# Patient Record
Sex: Female | Born: 1975 | Race: White | Hispanic: Yes | Smoking: Never smoker
Health system: Southern US, Community
[De-identification: ages and names within clinical notes are randomized; demographics above are authoritative.]

## PROBLEM LIST (undated history)

## (undated) ENCOUNTER — Ambulatory Visit (HOSPITAL_COMMUNITY): Source: Home / Self Care

## (undated) DIAGNOSIS — E119 Type 2 diabetes mellitus without complications: Secondary | ICD-10-CM

## (undated) HISTORY — PX: TUBAL LIGATION: SHX77

## (undated) HISTORY — PX: CARPAL TUNNEL RELEASE: SHX101

## (undated) HISTORY — PX: WISDOM TOOTH EXTRACTION: SHX21

---

## 2015-09-18 ENCOUNTER — Emergency Department (HOSPITAL_COMMUNITY): Payer: Self-pay

## 2015-09-18 ENCOUNTER — Emergency Department (HOSPITAL_COMMUNITY)
Admission: EM | Admit: 2015-09-18 | Discharge: 2015-09-18 | Disposition: A | Discharge status: Home / Self Care | Payer: Self-pay | Attending: Emergency Medicine | Admitting: Emergency Medicine

## 2015-09-18 ENCOUNTER — Encounter (HOSPITAL_COMMUNITY): Payer: Self-pay

## 2015-09-18 DIAGNOSIS — S93402A Sprain of unspecified ligament of left ankle, initial encounter: Secondary | ICD-10-CM | POA: Insufficient documentation

## 2015-09-18 DIAGNOSIS — Z880 Allergy status to penicillin: Secondary | ICD-10-CM | POA: Insufficient documentation

## 2015-09-18 DIAGNOSIS — Y998 Other external cause status: Secondary | ICD-10-CM | POA: Insufficient documentation

## 2015-09-18 DIAGNOSIS — E119 Type 2 diabetes mellitus without complications: Secondary | ICD-10-CM | POA: Insufficient documentation

## 2015-09-18 DIAGNOSIS — M79672 Pain in left foot: Secondary | ICD-10-CM

## 2015-09-18 DIAGNOSIS — W1841XA Slipping, tripping and stumbling without falling due to stepping on object, initial encounter: Secondary | ICD-10-CM | POA: Insufficient documentation

## 2015-09-18 DIAGNOSIS — Y9389 Activity, other specified: Secondary | ICD-10-CM | POA: Insufficient documentation

## 2015-09-18 DIAGNOSIS — Y9289 Other specified places as the place of occurrence of the external cause: Secondary | ICD-10-CM | POA: Insufficient documentation

## 2015-09-18 HISTORY — DX: Type 2 diabetes mellitus without complications: E11.9

## 2015-09-18 MED ORDER — IBUPROFEN 800 MG PO TABS: 800.0000 mg | ORAL_TABLET | Freq: Three times a day (TID) | ORAL | Status: DC

## 2015-09-18 NOTE — ED Notes (Signed)
Refused crutches

## 2015-09-18 NOTE — Discharge Instructions (Signed)

## 2015-09-18 NOTE — ED Notes (Signed)
Pt reports she tripped off her back deck this morning around 1030 and her whole entire left foot is hurting. Mild swelling to foot, no discoloration at this time. No LOC, not on blood thinners. Pt able to wiggle toes, + sensation, + pedal pulse.

## 2015-09-18 NOTE — ED Provider Notes (Signed)
CSN: [id number]     Arrival date & time 09/18/15  1622 History  This chart was scribed for non-physician Laurie Justiss [name], NP-C, working with Laurie Baptist, MD by Phillis Haggis, ED Scribe. This patient was seen in room TR02C/TR02C and patient care was started at 4:45 PM.   Chief Complaint  Patient presents with  . Foot Injury   The history is provided by the patient. No language interpreter was used.  HPI Comments: Laurie Neal is a 39 y.o. Female with a hx of DM who presents to the Emergency Department complaining of left foot injury onset 6 hours ago. Pt states that she tripped off the back deck, injuring her left foot. Reports associated left foot and ankle pain, with mild swelling. Reports worsening pain with weight bearing and ambulation. Denies any other injuries, hx of injury to the same area, or LOC.   Past Medical History  Diagnosis Date  . Diabetes mellitus without complication    Past Surgical History  Procedure Laterality Date  . Tubal ligation    . Wisdom tooth extraction    . Carpal tunnel release Left    No family history on file. Social History  Substance Use Topics  . Smoking status: Never Smoker   . Smokeless tobacco: None  . Alcohol Use: No   OB History    No data available     Review of Systems  Musculoskeletal: Positive for arthralgias.  Neurological: Negative for syncope.  All other systems reviewed and are negative.  Allergies  Penicillins  Home Medications   Prior to Admission medications   Not on File   BP 123/82 mmHg  Pulse 89  Temp(Src) 97.9 F (36.6 C) (Oral)  Resp 18  Ht  (1.549 m)  Wt 280 lb (127.007 kg)  BMI 52.93 kg/m2  SpO2 98%  LMP 08/18/2015  Physical Exam  Constitutional: She is oriented to person, place, and time. She appears well-developed and well-nourished.  HENT:  Head: Normocephalic and atraumatic.  Eyes: EOM are normal.  Neck: Normal range of motion. Neck supple.  Cardiovascular: Normal rate.    Pulmonary/Chest: Effort normal.  Musculoskeletal: Normal range of motion.  Tender on the lateral left foot and ankle. No gross deformity noted. Pulses intact  Neurological: She is alert and oriented to person, place, and time.  Skin: Skin is warm and dry.  Psychiatric: She has a normal mood and affect. Her behavior is normal.  Nursing note and vitals reviewed.   ED Course  Procedures (including critical care time) DIAGNOSTIC STUDIES: Oxygen Saturation is 98% on RA, normal by my interpretation.    COORDINATION OF CARE: 4:47 PM-Discussed treatment plan which includes x-ray (CXR, CBC panel, CMP, UA) with pt at bedside and pt agreed to plan.   Labs Review Labs Reviewed - No data to display  Imaging Review Dg Ankle Complete Left  09/18/2015   CLINICAL DATA:  Fall off step onto foot and ankle. Pain. Initial encounter.  EXAM: LEFT ANKLE COMPLETE - 3+ VIEW  COMPARISON:  None.  FINDINGS: The mineralization and alignment are normal. There is no evidence of acute fracture or dislocation. The joint spaces are maintained. There is minimal spurring of the medial malleolus and inferior calcaneal tuberosity. The soft tissues around the ankle are prominent without apparent focal swelling.  IMPRESSION: No acute osseous findings.   Electronically Signed   By: Carey Bullocks M.D.   On: 09/18/2015 17:42   Dg Foot Complete Left  09/18/2015   CLINICAL  DATA:  Fall off step onto foot and ankle. Pain. History of diabetes. Initial encounter.  EXAM: LEFT FOOT - COMPLETE 3+ VIEW  COMPARISON:  None.  FINDINGS: The mineralization and alignment are normal. There is no evidence of acute fracture or dislocation. The joint spaces are maintained. The alignment is normal at the Lisfranc joint. The tibial sesamoid of the first metatarsal is bipartite. Mild plantar calcaneal spurring noted.  IMPRESSION: No acute osseous findings.   Electronically Signed   By: Carey Bullocks M.D.   On: 09/18/2015 17:41      EKG  Interpretation None      MDM   Final diagnoses:  Ankle sprain, left, initial encounter  Left foot pain    No acute bony injury. Pt placed in aso and crutches for comfort  I personally performed the services described in this documentation, which was scribed in my presence. The recorded information has been reviewed and is accurate.    [name], NP 09/18/15 1748  [name], NP 09/18/15 1758  Laurie Baptist, MD 09/19/15 1356

## 2015-12-13 DIAGNOSIS — K769 Liver disease, unspecified: Secondary | ICD-10-CM | POA: Insufficient documentation

## 2016-10-31 IMAGING — DX DG FOOT COMPLETE 3+V*L*
3 series · 3 of 3 positions shown · non-contrast
Comparison: None.

CLINICAL DATA: Fall off step onto foot and ankle. Pain. History of
diabetes. Initial encounter.

EXAM:
LEFT FOOT - COMPLETE 3+ VIEW

[x foot ap left]
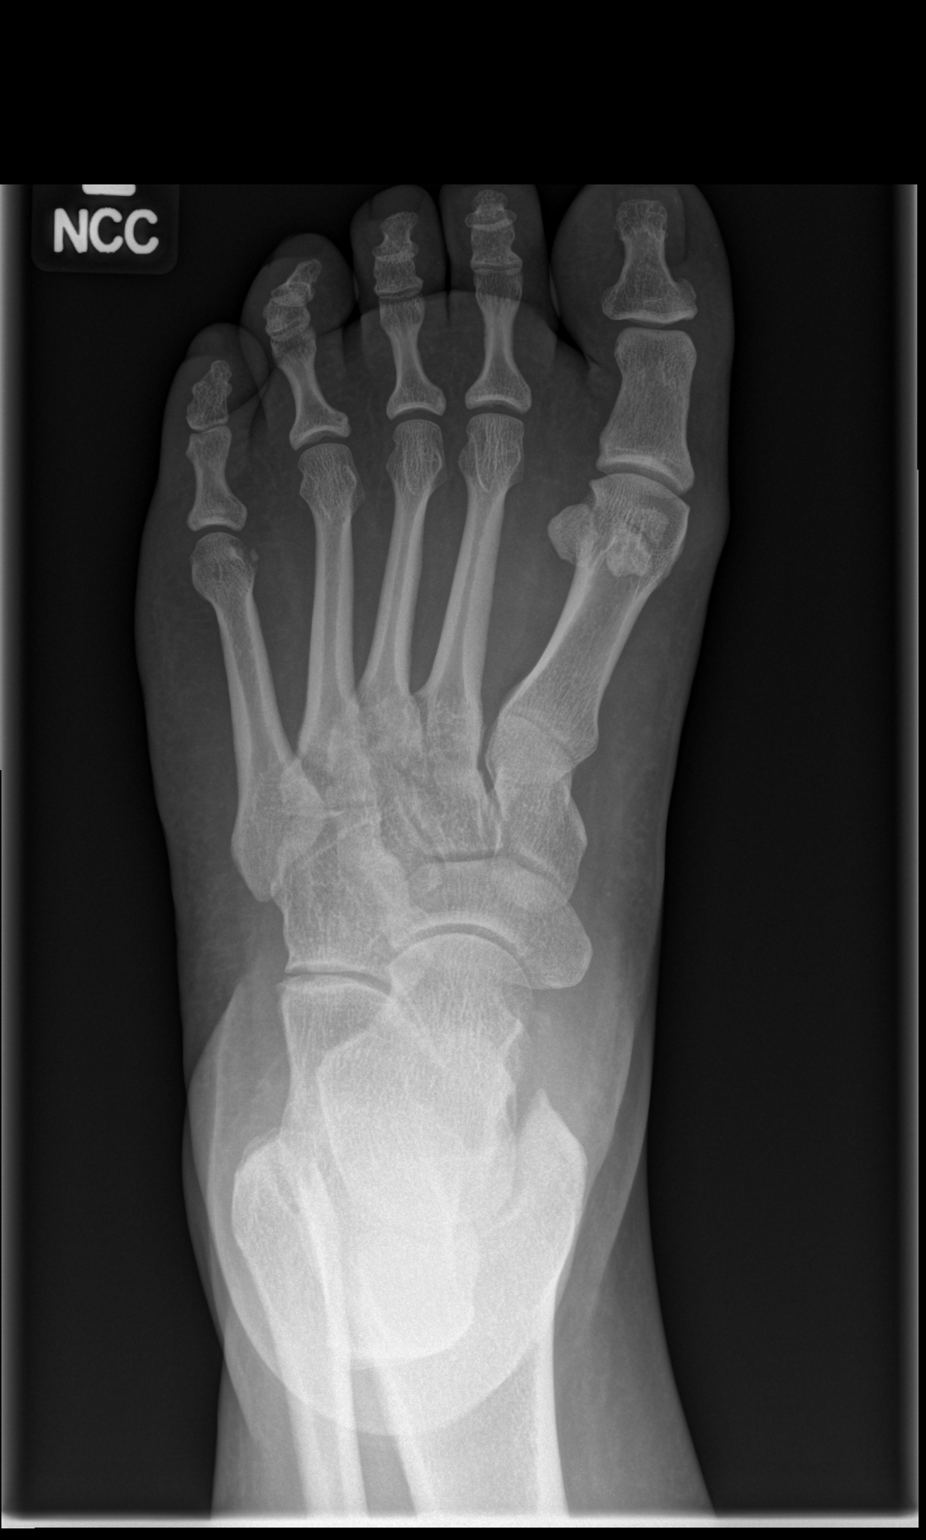

[x foot obl left]
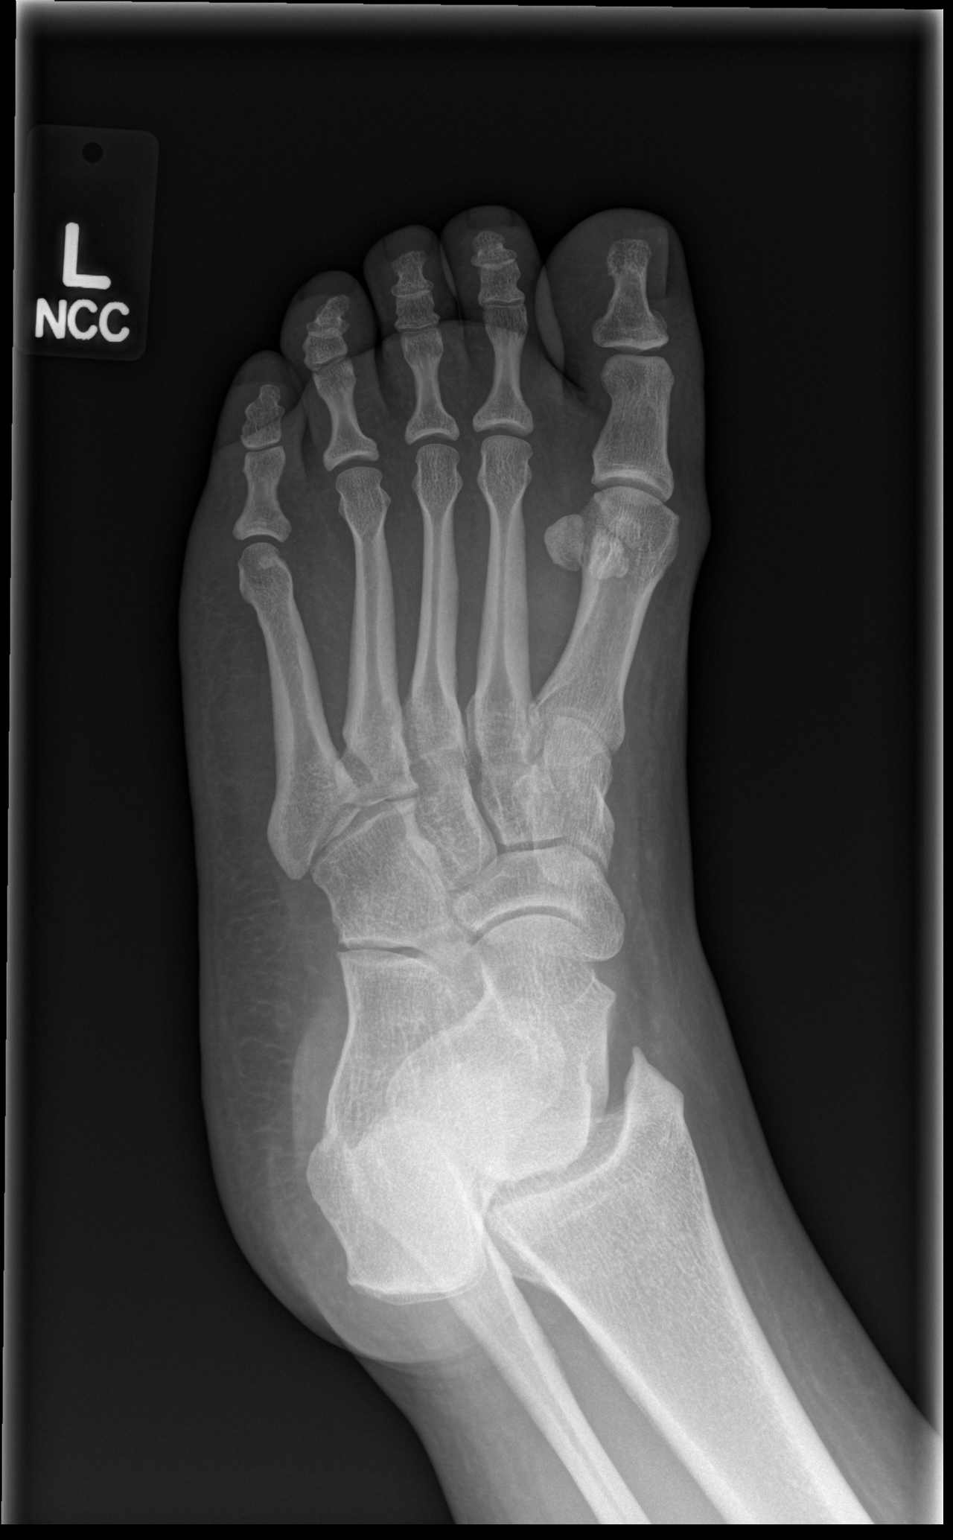

[x foot lat left]
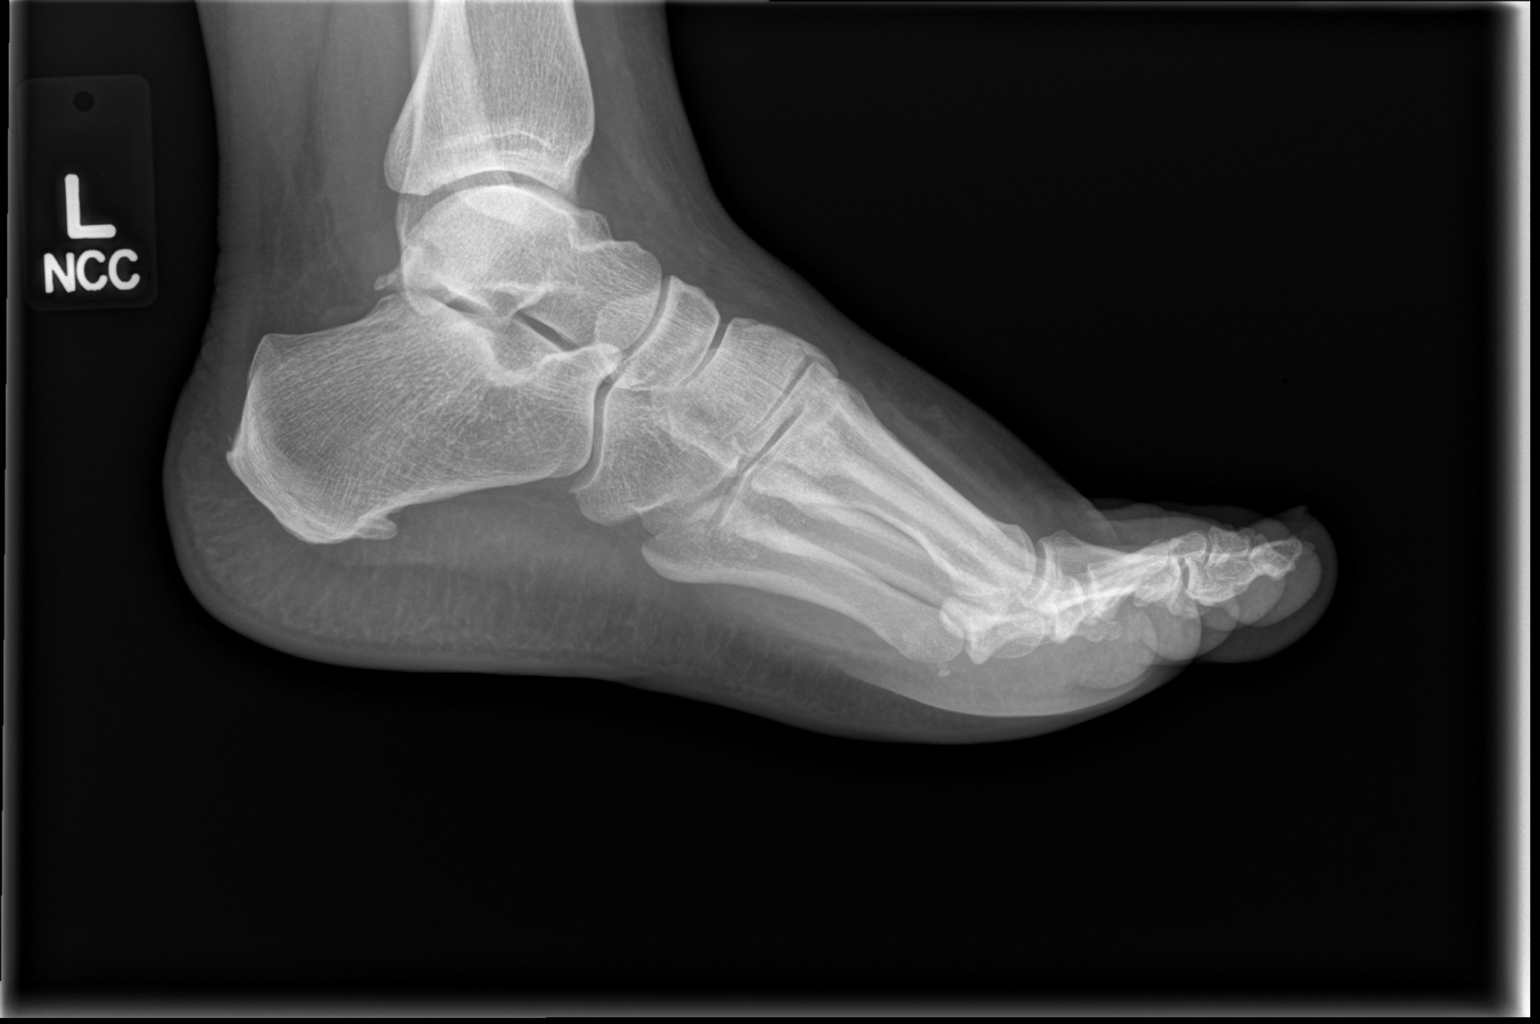

[3 of 3 positions shown; findings below may reference images not displayed]

FINDINGS: The mineralization and alignment are normal. There is no evidence of
acute fracture or dislocation. The joint spaces are maintained. The
alignment is normal at the Lisfranc joint. The tibial sesamoid of
the first metatarsal is bipartite. Mild plantar calcaneal spurring
noted.
IMPRESSION: No acute osseous findings.

## 2016-10-31 IMAGING — DX DG ANKLE COMPLETE 3+V*L*
3 series · 3 of 3 positions shown · non-contrast
Comparison: None.

CLINICAL DATA: Fall off step onto foot and ankle. Pain. Initial
encounter.

EXAM:
LEFT ANKLE COMPLETE - 3+ VIEW

[x ankle ap left]
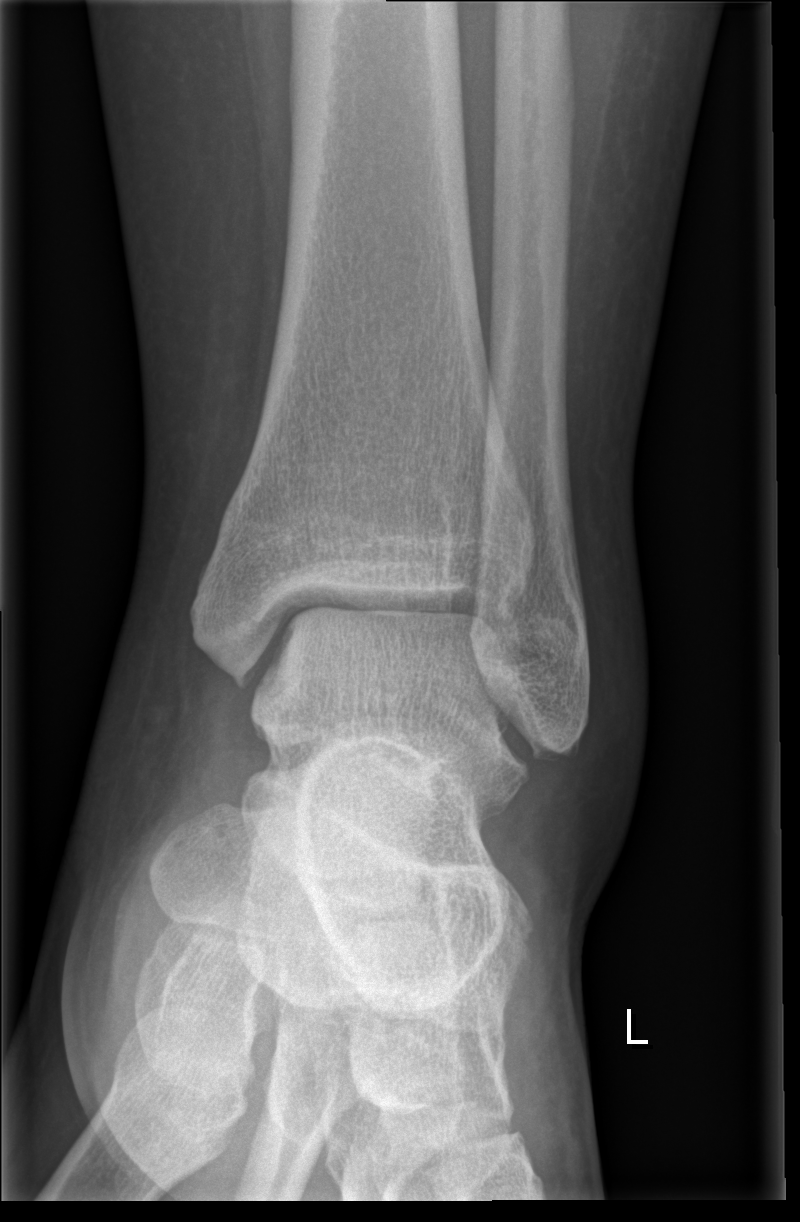

[x ankle obl left]
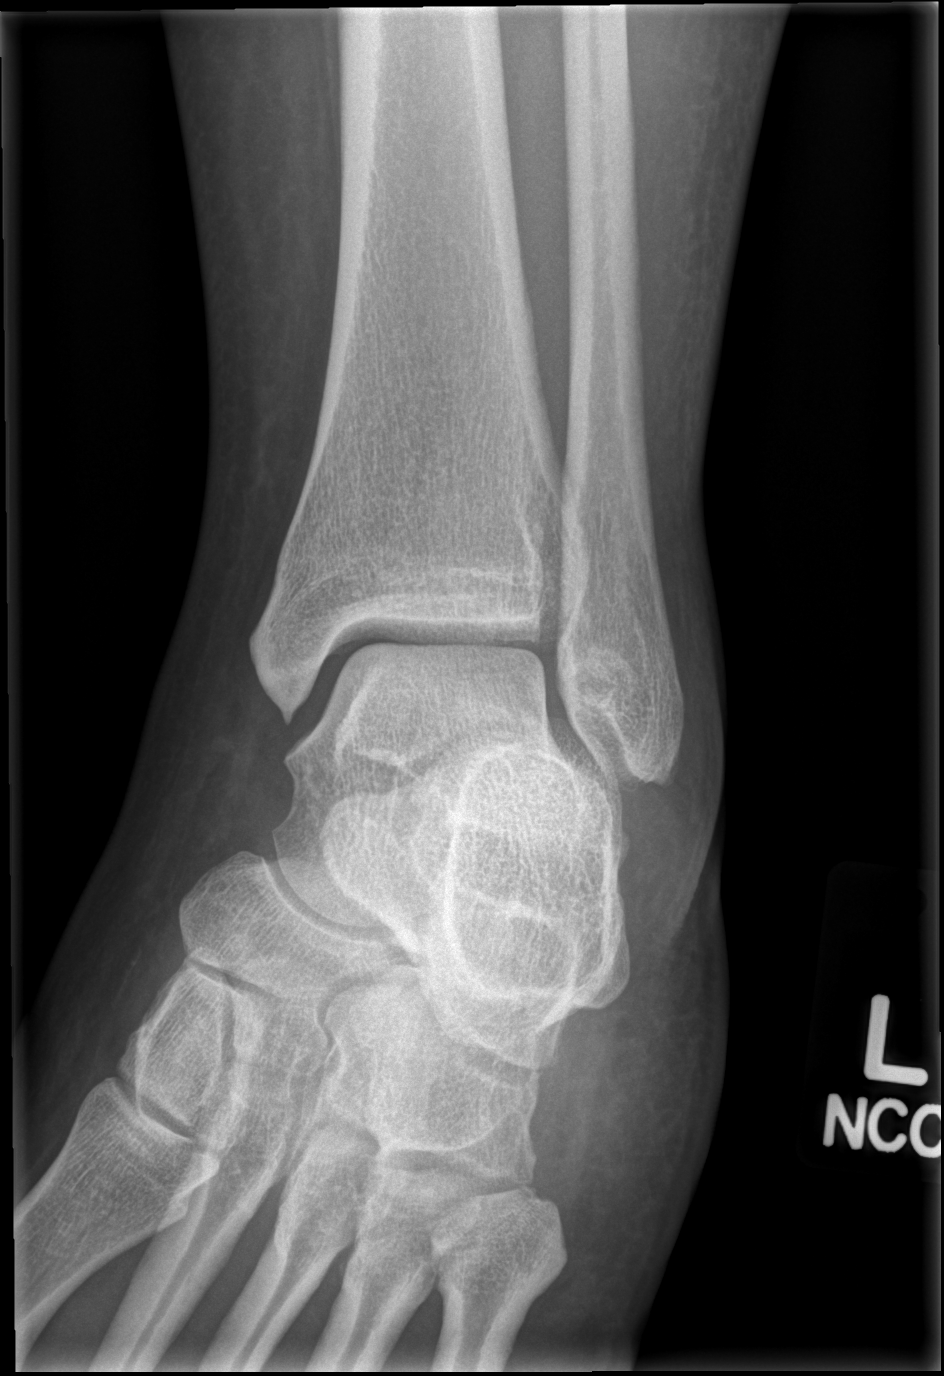

[x ankle lat left]
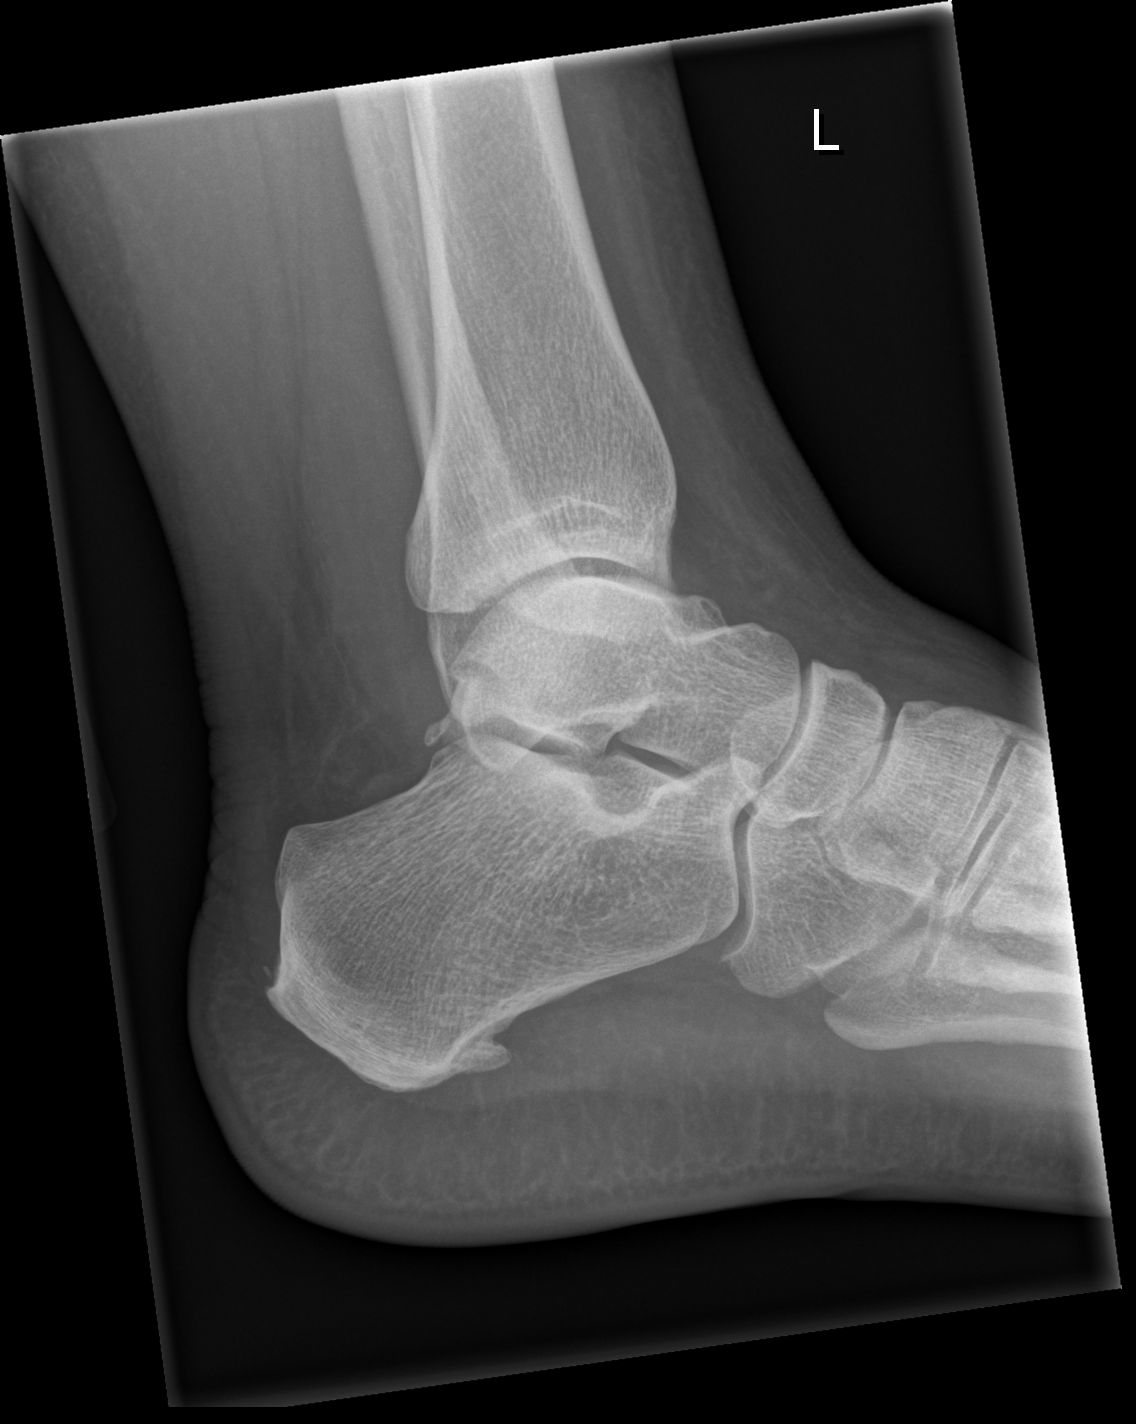

[3 of 3 positions shown; findings below may reference images not displayed]

FINDINGS: The mineralization and alignment are normal. There is no evidence of
acute fracture or dislocation. The joint spaces are maintained.
There is minimal spurring of the medial malleolus and inferior
calcaneal tuberosity. The soft tissues around the ankle are
prominent without apparent focal swelling.
IMPRESSION: No acute osseous findings.

## 2017-05-21 ENCOUNTER — Telehealth: Payer: Self-pay | Admitting: Emergency Medicine

## 2017-07-03 DIAGNOSIS — R809 Proteinuria, unspecified: Secondary | ICD-10-CM | POA: Insufficient documentation

## 2017-09-30 DIAGNOSIS — N182 Chronic kidney disease, stage 2 (mild): Secondary | ICD-10-CM | POA: Insufficient documentation

## 2017-09-30 DIAGNOSIS — K30 Functional dyspepsia: Secondary | ICD-10-CM | POA: Insufficient documentation

## 2017-09-30 DIAGNOSIS — E119 Type 2 diabetes mellitus without complications: Secondary | ICD-10-CM | POA: Insufficient documentation

## 2018-01-27 DIAGNOSIS — D573 Sickle-cell trait: Secondary | ICD-10-CM | POA: Insufficient documentation

## 2018-01-27 DIAGNOSIS — F419 Anxiety disorder, unspecified: Secondary | ICD-10-CM | POA: Insufficient documentation

## 2018-09-08 DIAGNOSIS — J309 Allergic rhinitis, unspecified: Secondary | ICD-10-CM | POA: Insufficient documentation

## 2021-03-15 LAB — RESULTS CONSOLE HPV: CHL HPV: NEGATIVE

## 2023-03-04 DIAGNOSIS — K824 Cholesterolosis of gallbladder: Secondary | ICD-10-CM | POA: Insufficient documentation

## 2023-05-31 LAB — HM MAMMOGRAPHY

## 2023-12-02 DIAGNOSIS — E785 Hyperlipidemia, unspecified: Secondary | ICD-10-CM | POA: Insufficient documentation

## 2024-06-01 ENCOUNTER — Encounter: Payer: Self-pay | Admitting: Family Medicine

## 2024-06-02 ENCOUNTER — Ambulatory Visit: Admitting: Family Medicine

## 2024-06-02 ENCOUNTER — Encounter: Payer: Self-pay | Admitting: Family Medicine

## 2024-06-02 VITALS — BP 124/78 | HR 85 | Temp 97.6°F | Ht 61.0 in | Wt 228.8 lb

## 2024-06-02 DIAGNOSIS — E119 Type 2 diabetes mellitus without complications: Secondary | ICD-10-CM | POA: Diagnosis not present

## 2024-06-02 DIAGNOSIS — Z7985 Long-term (current) use of injectable non-insulin antidiabetic drugs: Secondary | ICD-10-CM

## 2024-06-02 DIAGNOSIS — F419 Anxiety disorder, unspecified: Secondary | ICD-10-CM

## 2024-06-02 DIAGNOSIS — F32A Depression, unspecified: Secondary | ICD-10-CM

## 2024-06-02 DIAGNOSIS — E782 Mixed hyperlipidemia: Secondary | ICD-10-CM

## 2024-06-02 DIAGNOSIS — Z1211 Encounter for screening for malignant neoplasm of colon: Secondary | ICD-10-CM | POA: Diagnosis not present

## 2024-06-02 DIAGNOSIS — Z794 Long term (current) use of insulin: Secondary | ICD-10-CM | POA: Diagnosis not present

## 2024-06-02 MED ORDER — CITALOPRAM HYDROBROMIDE 10 MG PO TABS: 10.0000 mg | ORAL_TABLET | Freq: Every day | ORAL | 3 refills | Status: DC

## 2024-06-02 MED ORDER — BASAGLAR KWIKPEN 100 UNIT/ML ~~LOC~~ SOPN: 35.0000 [IU] | PEN_INJECTOR | Freq: Every day | SUBCUTANEOUS | 3 refills | Status: DC

## 2024-06-02 MED ORDER — FREESTYLE LIBRE 2 SENSOR MISC: 3 refills | Status: AC

## 2024-06-02 NOTE — Assessment & Plan Note (Signed)
 I will check annual DM labs today. It appears her insurance will cover insulin glargine (Basaglar). I will switch her to this. we will also continue insulin aspart (Novolog) 18 units with meals, and semaglutide (Ozempic) 2 mg weekly. I will renew her freestyle Laurie Neal so she can go back to monitoring her blood sugars.

## 2024-06-02 NOTE — Assessment & Plan Note (Signed)
 I will check her lipids today. Continue lovastatin 10 mg daily.

## 2024-06-02 NOTE — Patient Instructions (Signed)
Deep Breathing Technique ? ?1. Find a comfortable, quiet place to sit or lie down. Choose a spot where you know you won't be disturbed. If sitting, keep your back straight and your feet flat on the floor. Close your eyes. ? ?2. Place one hand on your belly, just below your ribs. Place the other hand on your chest. ? ?3. Take a regular breath. ? ?4. Now take a slow, deep breath over about 6 seconds. Breathe in slowly through your nose. Pay attention as your belly swells up under your hand. ? ?5. Holding your breath, pause for a four to five seconds. ? ?6. Slowly breathe out through your mouth over about 6 seconds. Pay attention as the hand on your belly goes in with the breath. ? ?7. Now add images to your breathing. As you inhale, imagine that the air you're breathing is spreading relaxation and calmness throughout your body. ? ?8. As you exhale, imagine that your breath is whooshing away stress and tension. ? ?9. Try to deep breathe for 6-10 cycles. ? ?

## 2024-06-02 NOTE — Progress Notes (Signed)
 [facility] [address] [place] [address] Dept: [phone] Dept Fax: [phone]  New Patient Office Visit  Subjective:    Patient ID: Laurie Neal, female    DOB: [birth year], 48 y.o..   MRN: [id number]  Chief Complaint  Patient presents with   Establish Care    NP establish care.  C/o having anxiety and being out of diabetes medication.    History of Present Illness:  Patient is in today to establish care. Laurie Neal was born in [place], [place]. She moved to [facility] when she was 48 years old. She does office work for [employer]. She has a boyfriend. She has two sons (25, 84) and two step-children she is still involved with (18, 21). She has 5 grandchildren. Laurie Neal denies use of tobacco, alcohol, or drugs.  Laurie Neal  has a history of Type 2 diabetes, diagnosed around 7-8 years ago. She was managed previously on metformin, Janumet, and Trulicity, however is currently managed on insulin glargine (Lantus) 35 untis at bedtime, insulin aspart (Novolog) 18 units with meals, and semaglutide (Ozempic) 2 mg weekly. She has experienced some disruption in her therapy related to a transition in medical insurance. She has been off of most of her meds over the past month. She was told her Lantus is not on formulary with her new insurance. She has recently been back on her Ozempic. She is also manage don losartan 25 mg daily for renal protection.  Laurie Neal has hyperlipidemia. She is managed on lovastatin 10 mg daily.  Laurie Neal has issues with right hand and bilateral foot pain. She takes naproxen for management of her discomfort.  Laurie Neal  notes an increasing issue with anxiety. She finds this to be primarily work-related, but notes she had some shakiness when coming to our clinic today. She also has some emotional lability, finding she snaps at times.  Past Medical History: Patient Active Problem List    Diagnosis Date Noted   Hyperlipidemia 12/02/2023   Gallbladder polyp 03/04/2023   Morbid obesity (HCC) 09/10/2022   Allergic rhinitis 09/08/2018   Sickle cell trait (HCC) 01/27/2018   Anxiety and depression 01/27/2018   Type 2 diabetes mellitus without complications (HCC) 09/30/2017   Functional dyspepsia 09/30/2017   Microalbuminuria 07/03/2017   Liver lesion 12/13/2015   Past Surgical History:  Procedure Laterality Date   CARPAL TUNNEL RELEASE Left    TUBAL LIGATION     WISDOM TOOTH EXTRACTION     Family History  Problem Relation Age of Onset   Hypertension Mother    Cancer Father        prostrate cancer   Outpatient Medications Prior to Visit  Medication Sig Dispense Refill   glucose blood (FREESTYLE TEST STRIPS) test strip Check sugars twice daily     Insulin Aspart FlexPen (NOVOLOG) 100 UNIT/ML Inject 18 Units into the skin.     Insulin Pen Needle (BD PEN NEEDLE MINI ULTRAFINE) 31G X 5 MM MISC daily.     Lancets 28G Thin MISC Check glucose up to 3 times daily     loperamide (IMODIUM) 2 MG capsule Take 2 mg by mouth.     losartan (COZAAR) 25 MG tablet Take 1 tablet by mouth daily.     lovastatin (MEVACOR) 10 MG tablet Take 10 mg by mouth at bedtime.     ondansetron (ZOFRAN) 4 MG tablet Take 4 mg by mouth.     OZEMPIC, 2 MG/DOSE, 8  MG/3ML SOPN Inject 2 mg into the skin once a week.     insulin glargine (LANTUS SOLOSTAR) 100 UNIT/ML Solostar Pen Inject 35 Units into the skin daily.     ibuprofen  (ADVIL ,MOTRIN ) 800 MG tablet Take 1 tablet (800 mg total) by mouth 3 (three) times daily. 21 tablet 0   naproxen (NAPROSYN) 500 MG tablet Take 500 mg by mouth 2 (two) times daily.     No facility-administered medications prior to visit.   Allergies  Allergen Reactions   Latex Dermatitis and Other (See Comments)    Burning and itching  Burning and itching  Other reaction(s): Other (See Comments)  Burning and itching  Other reaction(s): Other (See Comments)  Burning and  itching  Other reaction(s): Other (See Comments)  Burning and itching   Penicillins Itching   Objective:   Today's Vitals   06/02/24 1552  BP: 124/78  Pulse: 85  Temp: 97.6 F (36.4 C)  TempSrc: Temporal  SpO2: 99%  Weight: 228 lb 12.8 oz (103.8 kg)  Height: 5\' 1"  (1.549 m)   Body mass index is 43.23 kg/m.   General: Well developed, well nourished. No acute distress. Psych: Alert and oriented. Normal mood and affect.  Health Maintenance Due  Topic Date Due   HEMOGLOBIN A1C  Never done   FOOT EXAM  Never done   OPHTHALMOLOGY EXAM  Never done   HIV Screening  Never done   Diabetic kidney evaluation - eGFR measurement  Never done   Diabetic kidney evaluation - Urine ACR  Never done   Hepatitis C Screening  Never done   DTaP/Tdap/Td (1 - Tdap) Never done   Pneumococcal Vaccine 24-40 Years old (1 of 2 - PCV) Never done   Cervical Cancer Screening (HPV/Pap Cotest)  Never done   Colonoscopy  Never done     Assessment & Plan:   Problem List Items Addressed This Visit       Endocrine   Type 2 diabetes mellitus without complications (HCC) - Primary   I will check annual DM labs today. It appears her insurance will cover insulin glargine (Basaglar). I will switch her to this. we will also continue insulin aspart (Novolog) 18 units with meals, and semaglutide (Ozempic) 2 mg weekly. I will renew her freestyle Jerrilyn Moras so she can go back to monitoring her blood sugars.      Relevant Medications   OZEMPIC, 2 MG/DOSE, 8 MG/3ML SOPN   lovastatin (MEVACOR) 10 MG tablet   losartan (COZAAR) 25 MG tablet   Insulin Aspart FlexPen (NOVOLOG) 100 UNIT/ML   Insulin Glargine (BASAGLAR KWIKPEN) 100 UNIT/ML   Continuous Glucose Sensor (FREESTYLE LIBRE 2 SENSOR) MISC   Other Relevant Orders   Lipid panel   Microalbumin / creatinine urine ratio   Basic metabolic panel with GFR   Hemoglobin A1c   Urinalysis, Routine w reflex microscopic     Other   Anxiety and depression   Discussed  possible counseling. Recommend deep breathing to control anxious feelings. I will start ehr on citalopram 10 mg daily. Reassess in 6 weeks.      Relevant Medications   citalopram (CELEXA) 10 MG tablet   Hyperlipidemia   I will check her lipids today. Continue lovastatin 10 mg daily.      Relevant Medications   lovastatin (MEVACOR) 10 MG tablet   losartan (COZAAR) 25 MG tablet   Other Visit Diagnoses       Screening for colon cancer       Relevant  Orders   Ambulatory referral to Gastroenterology       Return in about 6 weeks (around 07/14/2024) for Reassessment.   Graig Lawyer, MD

## 2024-06-02 NOTE — Assessment & Plan Note (Signed)
 Discussed possible counseling. Recommend deep breathing to control anxious feelings. I will start ehr on citalopram 10 mg daily. Reassess in 6 weeks.

## 2024-06-03 ENCOUNTER — Other Ambulatory Visit

## 2024-06-03 ENCOUNTER — Ambulatory Visit: Payer: Self-pay | Admitting: Family Medicine

## 2024-06-03 DIAGNOSIS — R7309 Other abnormal glucose: Secondary | ICD-10-CM

## 2024-06-03 DIAGNOSIS — N182 Chronic kidney disease, stage 2 (mild): Secondary | ICD-10-CM | POA: Insufficient documentation

## 2024-06-03 LAB — BASIC METABOLIC PANEL WITH GFR
BUN: 11 mg/dL (ref 6–23)
CO2: 27 meq/L (ref 19–32)
Calcium: 9.7 mg/dL (ref 8.4–10.5)
Chloride: 99 meq/L (ref 96–112)
Creatinine, Ser: 0.85 mg/dL (ref 0.40–1.20)
GFR: 82.68 mL/min (ref >60.00)
Glucose, Bld: 371 mg/dL — ABNORMAL HIGH (ref 70–99)
Potassium: 3.9 meq/L (ref 3.5–5.1)
Sodium: 134 meq/L — ABNORMAL LOW (ref 135–145)

## 2024-06-03 LAB — LIPID PANEL
Cholesterol: 159 mg/dL (ref 0–200)
HDL: 32 mg/dL — ABNORMAL LOW (ref >39.00)
LDL Cholesterol: 99 mg/dL (ref 0–99)
NonHDL: 127.24
Total CHOL/HDL Ratio: 5
Triglycerides: 142 mg/dL (ref 0.0–149.0)
VLDL: 28.4 mg/dL (ref 0.0–40.0)

## 2024-06-03 LAB — URINALYSIS, ROUTINE W REFLEX MICROSCOPIC
Bilirubin Urine: NEGATIVE
Ketones, ur: NEGATIVE
Nitrite: NEGATIVE
Specific Gravity, Urine: 1.01 (ref 1.000–1.030)
Total Protein, Urine: NEGATIVE
Urine Glucose: >=1000 — AB
Urobilinogen, UA: 0.2 (ref 0.0–1.0)
pH: 6.5 (ref 5.0–8.0)

## 2024-06-03 LAB — MICROALBUMIN / CREATININE URINE RATIO
Creatinine,U: 27.2 mg/dL
Microalb Creat Ratio: 59.1 mg/g — ABNORMAL HIGH (ref 0.0–30.0)
Microalb, Ur: 1.6 mg/dL (ref 0.0–1.9)

## 2024-06-04 ENCOUNTER — Ambulatory Visit: Payer: Self-pay | Admitting: Family Medicine

## 2024-06-04 LAB — HEMOGLOBIN A1C
Est. average glucose Bld gHb Est-mCnc: 280 mg/dL
Hgb A1c MFr Bld: 11.4 % — ABNORMAL HIGH (ref 4.8–5.6)

## 2024-06-05 ENCOUNTER — Telehealth: Payer: Self-pay

## 2024-06-05 NOTE — Telephone Encounter (Signed)
 Copied from CRM [id number]. Topic: General - Other >> Jun 05, 2024  8:20 AM Antonieta Kitten wrote: Reason for CRM: Patient requesting a call to get help setting her arm device app measurements

## 2024-06-05 NOTE — Telephone Encounter (Signed)
 Spoke to patient and advised that we would have to have her come in for a nurse visit so I could see what her app on her phone is telling her.  She declined appt and will wait till her upcoming appt. Dm/cma

## 2024-07-01 ENCOUNTER — Encounter: Payer: Self-pay | Admitting: Family Medicine

## 2024-07-01 ENCOUNTER — Ambulatory Visit: Admitting: Family Medicine

## 2024-07-01 VITALS — BP 120/72 | HR 88 | Temp 97.8°F | Ht 61.0 in | Wt 228.6 lb

## 2024-07-01 DIAGNOSIS — B353 Tinea pedis: Secondary | ICD-10-CM

## 2024-07-01 DIAGNOSIS — N182 Chronic kidney disease, stage 2 (mild): Secondary | ICD-10-CM | POA: Diagnosis not present

## 2024-07-01 DIAGNOSIS — E782 Mixed hyperlipidemia: Secondary | ICD-10-CM

## 2024-07-01 DIAGNOSIS — Z794 Long term (current) use of insulin: Secondary | ICD-10-CM

## 2024-07-01 DIAGNOSIS — F419 Anxiety disorder, unspecified: Secondary | ICD-10-CM

## 2024-07-01 DIAGNOSIS — B351 Tinea unguium: Secondary | ICD-10-CM

## 2024-07-01 DIAGNOSIS — Z7985 Long-term (current) use of injectable non-insulin antidiabetic drugs: Secondary | ICD-10-CM

## 2024-07-01 DIAGNOSIS — F32A Depression, unspecified: Secondary | ICD-10-CM

## 2024-07-01 DIAGNOSIS — E1122 Type 2 diabetes mellitus with diabetic chronic kidney disease: Secondary | ICD-10-CM

## 2024-07-01 MED ORDER — LOSARTAN POTASSIUM 25 MG PO TABS: 25.0000 mg | ORAL_TABLET | Freq: Every day | ORAL | 3 refills | Status: AC

## 2024-07-01 MED ORDER — OZEMPIC (2 MG/DOSE) 8 MG/3ML ~~LOC~~ SOPN: 2.0000 mg | PEN_INJECTOR | SUBCUTANEOUS | 3 refills | Status: AC

## 2024-07-01 MED ORDER — BASAGLAR KWIKPEN 100 UNIT/ML ~~LOC~~ SOPN: 45.0000 [IU] | PEN_INJECTOR | Freq: Every day | SUBCUTANEOUS | 3 refills | Status: DC

## 2024-07-01 MED ORDER — CITALOPRAM HYDROBROMIDE 20 MG PO TABS: 20.0000 mg | ORAL_TABLET | Freq: Every day | ORAL | 3 refills | Status: AC

## 2024-07-01 MED ORDER — LOVASTATIN 10 MG PO TABS: 10.0000 mg | ORAL_TABLET | Freq: Every day | ORAL | 3 refills | Status: AC

## 2024-07-01 NOTE — Progress Notes (Signed)
 [facility] [address] [place] [address] Dept: [phone] Dept Fax: [phone]  Chronic Care Office Visit  Subjective:    Patient ID: Laurie Neal, female    DOB: 1976-06-21, 48 y.o..   MRN: [id number]  Chief Complaint  Patient presents with   Diabetes    4 week f/u DM.  Home BS averaging 66-320.   Still feeling a little anxious.     History of Present Illness:  Patient is in today for reassessment of chronic medical issues.  Ms. Bassford  has a history of Type 2 diabetes, diagnosed around 7-8 years ago. She was managed previously on metformin, Janumet, and Trulicity, however is currently managed on insulin glargine  (Lantus) 35 untis at bedtime, insulin aspart (Novolog) 18 units with meals, and semaglutide  (Ozempic ) 2 mg weekly. She has been back on her medication, after disruption in her therapy prior to her first appointment with me. She feels frustrated by her blood sugars being up and down. She has had a sugar as low as 66. She often gets symptoms of a low sugar when it is in the 80s.   Ms. Marshman labs at her last visit showed stage 2 (G2/A2) chronic kidney disease. She is managed on losartan  25 mg daily.   Ms. Nutter has hyperlipidemia. She is managed on lovastatin  10 mg daily.   At her last visit, Ms. Jeane noted an increasing issue with anxiety. She finds this to be primarily work-related, but notes some shakiness when coming to our clinic. She also had some emotional lability, finding she snaps at times. I started her on citalopram . She notes that her mood is mildly improved on the 10 mg dose.  Past Medical History: Patient Active Problem List   Diagnosis Date Noted   Chronic kidney disease (CKD) stage G2/A2, mildly decreased glomerular filtration rate (GFR) between 60-89 mL/min/1.73 square meter and albuminuria creatinine ratio between 30-299 mg/g 06/03/2024   Hyperlipidemia 12/02/2023   Gallbladder polyp  03/04/2023   Morbid obesity (HCC) 09/10/2022   Allergic rhinitis 09/08/2018   Sickle cell trait (HCC) 01/27/2018   Anxiety and depression 01/27/2018   Type 2 diabetes mellitus with stage 2 chronic kidney disease, with long-term current use of insulin (HCC) 09/30/2017   Functional dyspepsia 09/30/2017   Microalbuminuria 07/03/2017   Liver lesion 12/13/2015   Past Surgical History:  Procedure Laterality Date   CARPAL TUNNEL RELEASE Left    TUBAL LIGATION     WISDOM TOOTH EXTRACTION     Family History  Problem Relation Age of Onset   Hypertension Mother    Cancer Father        prostrate cancer   Hypertension Maternal Aunt    Cancer Maternal Grandmother        Throat   Outpatient Medications Prior to Visit  Medication Sig Dispense Refill   Continuous Glucose Sensor (FREESTYLE LIBRE 2 SENSOR) MISC Use as directed 6 each 3   glucose blood (FREESTYLE TEST STRIPS) test strip Check sugars twice daily     ibuprofen  (ADVIL ,MOTRIN ) 800 MG tablet Take 1 tablet (800 mg total) by mouth 3 (three) times daily. 21 tablet 0   Insulin Aspart FlexPen (NOVOLOG) 100 UNIT/ML Inject 18 Units into the skin.     Insulin Pen Needle (BD PEN NEEDLE MINI ULTRAFINE) 31G X 5 MM MISC daily.     Lancets 28G Thin MISC Check glucose up to 3 times daily     loperamide (IMODIUM) 2 MG capsule Take 2  mg by mouth.     naproxen (NAPROSYN) 500 MG tablet Take 500 mg by mouth 2 (two) times daily.     ondansetron (ZOFRAN) 4 MG tablet Take 4 mg by mouth.     citalopram  (CELEXA ) 10 MG tablet Take 1 tablet (10 mg total) by mouth daily. 30 tablet 3   Insulin Glargine  (BASAGLAR  KWIKPEN) 100 UNIT/ML Inject 35 Units into the skin daily. 15 mL 3   losartan  (COZAAR ) 25 MG tablet Take 1 tablet by mouth daily.     lovastatin  (MEVACOR ) 10 MG tablet Take 10 mg by mouth at bedtime.     OZEMPIC , 2 MG/DOSE, 8 MG/3ML SOPN Inject 2 mg into the skin once a week.     No facility-administered medications prior to visit.   Allergies   Allergen Reactions   Latex Dermatitis and Other (See Comments)    Burning and itching  Burning and itching  Other reaction(s): Other (See Comments)  Burning and itching  Other reaction(s): Other (See Comments)  Burning and itching  Other reaction(s): Other (See Comments)  Burning and itching   Penicillins Itching   Objective:   Today's Vitals   07/01/24 1502  BP: 120/72  Pulse: 88  Temp: 97.8 F (36.6 C)  TempSrc: Temporal  SpO2: 98%  Weight: 228 lb 9.6 oz (103.7 kg)  Height: 5' 1 (1.549 m)   Body mass index is 43.19 kg/m.   General: Well developed, well nourished. No acute distress. Feet- Skin intact. Mild maceration between toes and some small macules along the foot margin and the ankle. Nails are   discolored with some deformity and splitting. Dorsalis pedis and posterior tibial artery pulses are normal. 5.07   monofilament testing normal. Psych: Alert and oriented. Normal mood and affect.  Health Maintenance Due  Topic Date Due   OPHTHALMOLOGY EXAM  Never done   HIV Screening  Never done   Hepatitis C Screening  Never done   DTaP/Tdap/Td (1 - Tdap) Never done   Pneumococcal Vaccine 62-21 Years old (1 of 2 - PCV) Never done   Hepatitis B Vaccines (1 of 3 - 19+ 3-dose series) Never done   HPV VACCINES (1 - 3-dose SCDM series) Never done   Cervical Cancer Screening (HPV/Pap Cotest)  Never done   Colonoscopy  Never done     Lab Results: Lab Results  Component Value Date   HGBA1C 11.4 (H) 06/03/2024      Latest Ref Rng & Units 06/02/2024    4:43 PM  CMP  Glucose 70 - 99 mg/dL 628   BUN 6 - 23 mg/dL 11   Creatinine 9.59 - 1.20 mg/dL 9.14   Sodium 864 - 854 mEq/L 134   Potassium 3.5 - 5.1 mEq/L 3.9   Chloride 96 - 112 mEq/L 99   CO2 19 - 32 mEq/L 27   Calcium 8.4 - 10.5 mg/dL 9.7    Lab Results  Component Value Date   CHOL 159 06/02/2024   HDL 32.00 (L) 06/02/2024   LDLCALC 99 06/02/2024   TRIG 142.0 06/02/2024   CHOLHDL 5 06/02/2024    Component Ref Range & Units (hover) 4 wk ago  Microalb, Ur 1.6  Creatinine,U 27.2  Microalb Creat Ratio 59.1 High     Home Glucose CGM:  30-day avg: 194  14-day avg: 208  7-day avg: 200  Assessment & Plan:   Problem List Items Addressed This Visit       Endocrine   Type 2 diabetes mellitus with  stage 2 chronic kidney disease, with long-term current use of insulin (HCC) - Primary   Diabetes is in poor control. Based on CGM data, her A1c would be expected to be around 8.5%. Continue insulin aspart (Novolog) 18 units with meals, and semaglutide  (Ozempic ) 2 mg weekly. I will increase her insulin glargine  (Basaglar ) to 45 units at bedtime.      Relevant Medications   OZEMPIC , 2 MG/DOSE, 8 MG/3ML SOPN   losartan  (COZAAR ) 25 MG tablet   lovastatin  (MEVACOR ) 10 MG tablet   Insulin Glargine  (BASAGLAR  KWIKPEN) 100 UNIT/ML   Other Relevant Orders   Amb Referral to Nutrition and Diabetic Education     Genitourinary   Chronic kidney disease (CKD) stage G2/A2, mildly decreased glomerular filtration rate (GFR) between 60-89 mL/min/1.73 square meter and albuminuria creatinine ratio between 30-299 mg/g   Focus on blood pressure and glucose control, adequate hydration, and avoidance of nephrotoxic medications. Currently on an ARB.        Relevant Medications   losartan  (COZAAR ) 25 MG tablet     Other   Anxiety and depression   Partial response. I will increase her citalopram  to 20 mg daily. Reassess in 6 weeks.      Relevant Medications   citalopram  (CELEXA ) 20 MG tablet   Hyperlipidemia   Lipids are at goal. Continue lovastatin  10 mg daily.      Relevant Medications   losartan  (COZAAR ) 25 MG tablet   lovastatin  (MEVACOR ) 10 MG tablet   Other Visit Diagnoses       Onychomycosis       Relevant Orders   Ambulatory referral to Podiatry     Tinea pedis of both feet           Return in about 6 weeks (around 08/12/2024) for Reassessment.   Garnette CHRISTELLA Simpler, MD

## 2024-07-01 NOTE — Assessment & Plan Note (Signed)
 Focus on blood pressure and glucose control, adequate hydration, and avoidance of nephrotoxic medications. Currently on an ARB.

## 2024-07-01 NOTE — Assessment & Plan Note (Signed)
 Lipids are at goal. Continue lovastatin  10 mg daily.

## 2024-07-01 NOTE — Assessment & Plan Note (Signed)
 Partial response. I will increase her citalopram  to 20 mg daily. Reassess in 6 weeks.

## 2024-07-01 NOTE — Assessment & Plan Note (Signed)
 Diabetes is in poor control. Based on CGM data, her A1c would be expected to be around 8.5%. Continue insulin aspart (Novolog) 18 units with meals, and semaglutide  (Ozempic ) 2 mg weekly. I will increase her insulin glargine  (Basaglar ) to 45 units at bedtime.

## 2024-07-10 ENCOUNTER — Ambulatory Visit: Payer: Self-pay

## 2024-07-10 ENCOUNTER — Ambulatory Visit
Admission: EM | Admit: 2024-07-10 | Discharge: 2024-07-10 | Disposition: A | Discharge status: Home / Self Care | Source: Ambulatory Visit | Attending: Family Medicine | Admitting: Family Medicine

## 2024-07-10 VITALS — BP 131/75 | HR 69 | Temp 98.1°F | Resp 16

## 2024-07-10 DIAGNOSIS — H8110 Benign paroxysmal vertigo, unspecified ear: Secondary | ICD-10-CM | POA: Diagnosis not present

## 2024-07-10 LAB — POCT FASTING CBG KUC MANUAL ENTRY: POCT Glucose (KUC): 124 mg/dL — AB (ref 70–99)

## 2024-07-10 MED ORDER — MECLIZINE HCL 25 MG PO TABS: ORAL_TABLET | ORAL | 0 refills | Status: DC

## 2024-07-10 NOTE — Discharge Instructions (Addendum)
 You were seen today for dizziness that occurs with changes in position, such as turning your head or getting out of bed. Your symptoms are consistent with benign paroxysmal positional vertigo (BPPV), a common cause of dizziness related to movement. Your neurological exam was normal and there were no signs of a more serious issue at this time.  You were prescribed Meclizine to help with your symptoms. Take one tablet by mouth three times a day for the next two days, then as needed afterward. To reduce dizziness, move slowly when getting out of bed or changing positions. Avoid sudden head movements and try to sleep with your head elevated. Wear slip-on shoes to prevent bending over, and avoid driving if you feel dizzy when turning your head. Make sure to stay well hydrated throughout the day.  Follow up with your primary care provider as planned. You may return to work on Monday if your symptoms remain stable. Go to the emergency room if you develop new or worsening symptoms such as severe headache, neck stiffness, fainting, slurred speech, weakness, or changes in vision or hearing.

## 2024-07-10 NOTE — ED Provider Notes (Signed)
 [facility]    CSN: [id number] Arrival date & time: 07/10/24  0950      History   Chief Complaint Chief Complaint  Patient presents with   Dizziness    HPI Laurie Neal is a 48 y.o. female.   Discussed the use of AI scribe software for clinical note transcription with the patient, who gave verbal consent to proceed.   Patient presents with complaints of dizziness that worsens with head turning and positional changes such as getting up from bed. She describes the sensation as a feeling that things are moving or spinning. The symptoms began yesterday while at work and have persisted since then. She denies associated nausea, vomiting, numbness, tingling, weakness, neck pain, stiffness, chest pain, shortness of breath, or palpitations. She also denies recent illness and states this is her first episode of this type of dizziness. She has a history of diabetes with recent poor glycemic control and reports being off her diabetes medications for the past month. Her current regimen typically includes Lantus, NovoLog, and Ozempic , and she uses a continuous glucose monitor. She recently had an increase in Celexa  for anxiety from 10 mg to 20 mg, which she started last month. She tolerated the 10 mg dose well, and this is her first time using medication for anxiety.  The following portions of the patient's history were reviewed and updated as appropriate: allergies, current medications, past family history, past medical history, past social history, past surgical history, and problem list.     Past Medical History:  Diagnosis Date   Diabetes mellitus without complication (HCC)     Patient Active Problem List   Diagnosis Date Noted   Chronic kidney disease (CKD) stage G2/A2, mildly decreased glomerular filtration rate (GFR) between 60-89 mL/min/1.73 square meter and albuminuria creatinine ratio between 30-299 mg/g 06/03/2024   Hyperlipidemia 12/02/2023   Gallbladder polyp  03/04/2023   Morbid obesity (HCC) 09/10/2022   Allergic rhinitis 09/08/2018   Sickle cell trait (HCC) 01/27/2018   Anxiety and depression 01/27/2018   Type 2 diabetes mellitus with stage 2 chronic kidney disease, with long-term current use of insulin (HCC) 09/30/2017   Functional dyspepsia 09/30/2017   Microalbuminuria 07/03/2017   Liver lesion 12/13/2015    Past Surgical History:  Procedure Laterality Date   CARPAL TUNNEL RELEASE Left    TUBAL LIGATION     WISDOM TOOTH EXTRACTION      OB History   No obstetric history on file.      Home Medications    Prior to Admission medications   Medication Sig Start Date End Date Taking? Authorizing Provider  meclizine (ANTIVERT) 25 MG tablet Take 1 tablet (25 mg total) by mouth 3 (three) times daily for the next two days (Friday & Saturday) then may take 1 tablet three times a day AS NEEDED for dizziness/vertigo 07/10/24  Yes Karletta Millay, FNP  citalopram  (CELEXA ) 20 MG tablet Take 1 tablet (20 mg total) by mouth daily. 07/01/24   Thedora Garnette HERO, MD  Continuous Glucose Sensor (FREESTYLE [place] 2 SENSOR) MISC Use as directed 06/02/24   Thedora Garnette HERO, MD  glucose blood (FREESTYLE TEST STRIPS) test strip Check sugars twice daily 11/20/21   [provider]  ibuprofen  (ADVIL ,MOTRIN ) 800 MG tablet Take 1 tablet (800 mg total) by mouth 3 (three) times daily. 09/18/15   Pickering, Vrinda, NP  Insulin Aspart FlexPen (NOVOLOG) 100 UNIT/ML Inject 18 Units into the skin. 04/27/23   [provider]  Insulin Glargine  (BASAGLAR  [facility])  100 UNIT/ML Inject 45 Units into the skin at bedtime. 07/01/24   Thedora Garnette HERO, MD  Insulin Pen Needle (BD PEN NEEDLE MINI ULTRAFINE) 31G X 5 MM MISC daily. 08/02/22   [provider]  Lancets 28G Thin MISC Check glucose up to 3 times daily 11/20/21   [provider]  loperamide (IMODIUM) 2 MG capsule Take 2 mg by mouth. 02/13/15   [provider]  losartan  (COZAAR ) 25 MG tablet  Take 1 tablet (25 mg total) by mouth daily. 07/01/24   Thedora Garnette HERO, MD  lovastatin  (MEVACOR ) 10 MG tablet Take 1 tablet (10 mg total) by mouth at bedtime. 07/01/24   Thedora Garnette HERO, MD  naproxen (NAPROSYN) 500 MG tablet Take 500 mg by mouth 2 (two) times daily.    [provider]  ondansetron (ZOFRAN) 4 MG tablet Take 4 mg by mouth. 07/26/21   [provider]  OZEMPIC , 2 MG/DOSE, 8 MG/3ML SOPN Inject 2 mg into the skin once a week. 07/01/24   Thedora Garnette HERO, MD    Family History Family History  Problem Relation Age of Onset   Hypertension Mother    Cancer Father        prostrate cancer   Hypertension Maternal Aunt    Cancer Maternal Grandmother        Throat    Social History Social History   Tobacco Use   Smoking status: Never  Vaping Use   Vaping status: Never Used  Substance Use Topics   Alcohol use: No   Drug use: Never     Allergies   Latex and Penicillins   Review of Systems Review of Systems  Constitutional:  Positive for fever.  HENT: Negative.    Respiratory:  Negative for shortness of breath.   Cardiovascular:  Negative for chest pain and palpitations.  Gastrointestinal:  Negative for nausea and vomiting.  Musculoskeletal: Negative.   Neurological:  Positive for dizziness. Negative for weakness and numbness.  All other systems reviewed and are negative.    Physical Exam Triage Vital Signs ED Triage Vitals  Encounter Vitals Group     BP 07/10/24 1105 131/75     Girls Systolic BP Percentile --      Girls Diastolic BP Percentile --      Boys Systolic BP Percentile --      Boys Diastolic BP Percentile --      Pulse Rate 07/10/24 1105 69     Resp 07/10/24 1105 16     Temp 07/10/24 1105 98.1 F (36.7 C)     Temp Source 07/10/24 1105 Oral     SpO2 07/10/24 1105 97 %     Weight --      Height --      Head Circumference --      Peak Flow --      Pain Score 07/10/24 1108 0     Pain Loc --      Pain Education --      Exclude from  Growth Chart --    No data found.  Updated Vital Signs BP 131/75 (BP Location: Right Arm)   Pulse 69   Temp 98.1 F (36.7 C) (Oral)   Resp 16   LMP 07/05/2024 (Approximate)   SpO2 97%   Visual Acuity Right Eye Distance:   Left Eye Distance:   Bilateral Distance:    Right Eye Near:   Left Eye Near:    Bilateral Near:     Physical Exam  Vitals reviewed.  Constitutional:      General: She is awake. She is not in acute distress.    Appearance: Normal appearance. She is well-developed and well-groomed. She is not ill-appearing, toxic-appearing or diaphoretic.  HENT:     Head: Normocephalic.     Right Ear: Hearing, tympanic membrane, ear canal and external ear normal.     Left Ear: Hearing, tympanic membrane, ear canal and external ear normal.     Nose: Nose normal.     Mouth/Throat:     Mouth: Mucous membranes are moist.     Pharynx: Oropharynx is clear. Uvula midline.  Eyes:     General: Vision grossly intact. No visual field deficit.    Extraocular Movements: Extraocular movements intact.     Right eye: Normal extraocular motion and no nystagmus.     Left eye: Normal extraocular motion and no nystagmus.     Conjunctiva/sclera: Conjunctivae normal.     Pupils: Pupils are equal, round, and reactive to light.  Neck:     Trachea: Phonation normal.  Cardiovascular:     Rate and Rhythm: Normal rate and regular rhythm.     Heart sounds: Normal heart sounds.  Pulmonary:     Effort: Pulmonary effort is normal.     Breath sounds: Normal breath sounds and air entry.  Abdominal:     Palpations: Abdomen is soft.  Musculoskeletal:        General: Normal range of motion.     Cervical back: Full passive range of motion without pain, normal range of motion and neck supple.     Right lower leg: No edema.     Left lower leg: No edema.  Lymphadenopathy:     Cervical: No cervical adenopathy.  Skin:    General: Skin is warm and dry.  Neurological:     General: No focal deficit  present.     Mental Status: She is alert and oriented to person, place, and time.     Cranial Nerves: No cranial nerve deficit.     Sensory: Sensation is intact. No sensory deficit.     Motor: Motor function is intact.     Coordination: Coordination is intact.     Gait: Gait is intact.     Comments: Dix-Hallpike maneuver was positive; no other focal neurological deficits noted   Psychiatric:        Speech: Speech normal.        Behavior: Behavior is cooperative.      UC Treatments / Results  Labs (all labs ordered are listed, but only abnormal results are displayed) Labs Reviewed  POCT FASTING CBG [facility] MANUAL ENTRY - Abnormal; Notable for the following components:      Result Value   POCT Glucose ([facility]) 124 (*)    All other components within normal limits    EKG   Radiology No results found.  Procedures Procedures (including critical care time)  Medications Ordered in UC Medications - No data to display  Initial Impression / Assessment and Plan / UC Course  I have reviewed the triage vital signs and the nursing notes.  Pertinent labs & imaging results that were available during my care of the patient were reviewed by me and considered in my medical decision making (see chart for details).    Patient presents with dizziness triggered by turning the head, rising from bed, and other positional changes. There is no associated nausea, vomiting, numbness, tingling, or weakness, and no recent illness or neck pain  reported. Neurological exam and Dix-Hallpike maneuver were performed; no nystagmus or focal deficits noted. The patient is alert and in no acute distress. Symptoms are consistent with benign paroxysmal positional vertigo (BPPV), likely of peripheral origin. Meclizine was prescribed to be taken three times daily for two days, then as needed. Patient was counseled on avoiding sudden head movements, rising slowly from bed, staying hydrated, and avoiding driving if dizziness  occurs with head turning. Return to work is appropriate on Monday. Advised to follow up with primary care as already scheduled. Emergency evaluation is advised if symptoms worsen or new symptoms develop, including neck stiffness, fainting, slurred speech, weakness, or changes in hearing or vision.  Today's evaluation has revealed no signs of a dangerous process. Discussed diagnosis with patient and/or guardian. Patient and/or guardian aware of their diagnosis, possible red flag symptoms to watch out for and need for close follow up. Patient and/or guardian understands verbal and written discharge instructions. Patient and/or guardian comfortable with plan and disposition.  Patient and/or guardian has a clear mental status at this time, good insight into illness (after discussion and teaching) and has clear judgment to make decisions regarding their care  Documentation was completed with the aid of voice recognition software. Transcription may contain typographical errors.   Final Clinical Impressions(s) / UC Diagnoses   Final diagnoses:  Benign paroxysmal vertigo, unspecified laterality     Discharge Instructions      You were seen today for dizziness that occurs with changes in position, such as turning your head or getting out of bed. Your symptoms are consistent with benign paroxysmal positional vertigo (BPPV), a common cause of dizziness related to movement. Your neurological exam was normal and there were no signs of a more serious issue at this time.  You were prescribed Meclizine to help with your symptoms. Take one tablet by mouth three times a day for the next two days, then as needed afterward. To reduce dizziness, move slowly when getting out of bed or changing positions. Avoid sudden head movements and try to sleep with your head elevated. Wear slip-on shoes to prevent bending over, and avoid driving if you feel dizzy when turning your head. Make sure to stay well hydrated throughout  the day.  Follow up with your primary care provider as planned. You may return to work on Monday if your symptoms remain stable. Go to the emergency room if you develop new or worsening symptoms such as severe headache, neck stiffness, fainting, slurred speech, weakness, or changes in vision or hearing.      ED Prescriptions     Medication Sig Dispense Auth. Provider   meclizine (ANTIVERT) 25 MG tablet Take 1 tablet (25 mg total) by mouth 3 (three) times daily for the next two days (Friday & Saturday) then may take 1 tablet three times a day AS NEEDED for dizziness/vertigo 30 tablet Iola Lukes, FNP      PDMP not reviewed this encounter.   Iola Lukes, [place] 07/10/24 1201

## 2024-07-10 NOTE — Telephone Encounter (Signed)
 Noted. See that patient has went to an urgent care.

## 2024-07-10 NOTE — ED Triage Notes (Addendum)
 Pt c/o dizziness that began yesterday. States it is worse when she moves around.   States anxiety medication (celexa ) was increased last week form 10mg  to 20mg . Her insulin was also changed to basaglar  pen. Pt states her blood sugar goes up and down but she doesn't normally feel like this when sugar is off. She took out glucose monitor yesterday due to needing new one that is at pharmacy.   Denies chest pain, nausea, or weakness

## 2024-07-10 NOTE — Telephone Encounter (Signed)
 FYI Only or Action Required?: FYI only for provider.  Patient was last seen in primary care on 07/01/2024 by Thedora Garnette HERO, MD.  Called Nurse Triage reporting Dizziness.  Symptoms began yesterday.  Interventions attempted: Rest, hydration, or home remedies.  Symptoms are: unchanged.  Triage Disposition: See HCP Within 4 Hours (Or PCP Triage)  Patient/caregiver understands and will follow disposition?: Yes       Copied from CRM [id number]. Topic: Clinical - Red Word Triage >> Jul 10, 2024  8:06 AM Berneda FALCON wrote: Red Word that prompted transfer to Nurse Triage: Pt states she was experiencing dizziness at work yesterday. Went away for a little while but came back when she got back home. Then she experienced it all night and this morning still feeling off balance Reason for Disposition  [1] Dizziness caused by heat exposure, sudden standing, or poor fluid intake AND [2] no improvement after 2 hours of rest and fluids  Answer Assessment - Initial Assessment Questions 1. DESCRIPTION: Describe your dizziness.     Feeling off 2. LIGHTHEADED: Do you feel lightheaded? (e.g., somewhat faint, woozy, weak upon standing)     woozy 3. VERTIGO: Do you feel like either you or the room is spinning or tilting? (i.e., vertigo)     Room spinning when laying down 4. SEVERITY: How bad is it?  Do you feel like you are going to faint? Can you stand and walk?     Having to hold on to things 5. ONSET:  When did the dizziness begin?     yesterday 6. AGGRAVATING FACTORS: Does anything make it worse? (e.g., standing, change in head position)     Change in head, and getting up from sitting 7. HEART RATE: Can you tell me your heart rate? How many beats in 15 seconds?  (Note: Not all patients can do this.)       Can't tell 8. CAUSE: What do you think is causing the dizziness? (e.g., decreased fluids or food, diarrhea, emotional distress, heat exposure, new medicine, sudden standing,  vomiting; unknown)     New anxiety medication a week ago (Celexa ) 9. RECURRENT SYMPTOM: Have you had dizziness before? If Yes, ask: When was the last time? What happened that time?     never 10. OTHER SYMPTOMS: Do you have any other symptoms? (e.g., fever, chest pain, vomiting, diarrhea, bleeding)       anxious  Protocols used: Dizziness - Lightheadedness-A-AH

## 2024-07-15 ENCOUNTER — Ambulatory Visit: Admitting: Family Medicine

## 2024-08-12 ENCOUNTER — Ambulatory Visit: Admitting: Family Medicine

## 2024-08-12 ENCOUNTER — Encounter: Payer: Self-pay | Admitting: Family Medicine

## 2024-08-12 VITALS — BP 120/74 | HR 79 | Temp 97.0°F | Ht 61.0 in | Wt 232.2 lb

## 2024-08-12 DIAGNOSIS — R5383 Other fatigue: Secondary | ICD-10-CM | POA: Diagnosis not present

## 2024-08-12 DIAGNOSIS — N182 Chronic kidney disease, stage 2 (mild): Secondary | ICD-10-CM

## 2024-08-12 DIAGNOSIS — Z7985 Long-term (current) use of injectable non-insulin antidiabetic drugs: Secondary | ICD-10-CM

## 2024-08-12 DIAGNOSIS — F419 Anxiety disorder, unspecified: Secondary | ICD-10-CM | POA: Diagnosis not present

## 2024-08-12 DIAGNOSIS — F32A Depression, unspecified: Secondary | ICD-10-CM

## 2024-08-12 DIAGNOSIS — Z794 Long term (current) use of insulin: Secondary | ICD-10-CM

## 2024-08-12 DIAGNOSIS — E1122 Type 2 diabetes mellitus with diabetic chronic kidney disease: Secondary | ICD-10-CM

## 2024-08-12 DIAGNOSIS — M722 Plantar fascial fibromatosis: Secondary | ICD-10-CM | POA: Insufficient documentation

## 2024-08-12 DIAGNOSIS — Z9189 Other specified personal risk factors, not elsewhere classified: Secondary | ICD-10-CM | POA: Diagnosis not present

## 2024-08-12 NOTE — Assessment & Plan Note (Signed)
Improved. Continue citalopram 20mg daily

## 2024-08-12 NOTE — Assessment & Plan Note (Signed)
 She has failed more conservative approaches. She was given the number to call for an appointment with podiatry today. She should discuss a possible steroid injeciton of her heel with them.

## 2024-08-12 NOTE — Progress Notes (Signed)
 [facility] [address] [place] [address] Dept: [phone] Dept Fax: [phone]  Chronic Care Office Visit  Subjective:    Patient ID: Laurie Neal, female    DOB: 1976/07/25, 48 y.o..   MRN: [id number]  Chief Complaint  Patient presents with   Follow-up    6 week f/u anxiety.  C/o having fatigue and pain in the bottom of feet.     History of Present Illness:  Patient is in today for reassessment of chronic medical issues.  Ms. Karbowski  has a history of Type 2 diabetes, diagnosed around 7-8 years ago. She was managed previously on metformin, Janumet, and Trulicity, however is currently managed on insulin glargine  (Lantus) 45 untis at bedtime, insulin aspart (Novolog) 18 units with meals, and semaglutide  (Ozempic ) 2 mg weekly. She has been back on her medication, after disruption in her therapy prior to her first appointment with me. She notes she does not currently have her Freestyle [place] sensor. She feels she is getting some low blood sugars at time. She will eat peanut butter at these times.  Ms. Paglia had been having increasing issues with anxiety. I started her on citalopram . At her last visit, we increased this to 20 mg daily. She feels this is doing better at this point.  Ms. Raju complains of right foot pain, esp. near the heel. This is worse after periods of sitting for a long time. She has tried using stretches and ice massage. She also did not respond to NSAIDs.  Ms. Baltes also notes profound fatigue that she is experiencing on a daily basis. She notes she often feels like she needs to nap during the day. She has a history of snoring, though has never been told that she has apneic spells.  Past Medical History: Patient Active Problem List   Diagnosis Date Noted   Plantar fasciitis 08/12/2024   Chronic kidney disease (CKD) stage G2/A2, mildly decreased glomerular filtration rate (GFR) between 60-89 mL/min/1.73  square meter and albuminuria creatinine ratio between 30-299 mg/g 06/03/2024   Hyperlipidemia 12/02/2023   Gallbladder polyp 03/04/2023   Morbid obesity (HCC) 09/10/2022   Allergic rhinitis 09/08/2018   Sickle cell trait (HCC) 01/27/2018   Anxiety and depression 01/27/2018   Type 2 diabetes mellitus with stage 2 chronic kidney disease, with long-term current use of insulin (HCC) 09/30/2017   Functional dyspepsia 09/30/2017   Microalbuminuria 07/03/2017   Liver lesion 12/13/2015   Past Surgical History:  Procedure Laterality Date   CARPAL TUNNEL RELEASE Left    TUBAL LIGATION     WISDOM TOOTH EXTRACTION     Family History  Problem Relation Age of Onset   Hypertension Mother    Cancer Father        prostrate cancer   Hypertension Maternal Aunt    Cancer Maternal Grandmother        Throat   Outpatient Medications Prior to Visit  Medication Sig Dispense Refill   citalopram  (CELEXA ) 20 MG tablet Take 1 tablet (20 mg total) by mouth daily. 90 tablet 3   Continuous Glucose Sensor (FREESTYLE [place] 2 SENSOR) MISC Use as directed 6 each 3   glucose blood (FREESTYLE TEST STRIPS) test strip Check sugars twice daily     ibuprofen  (ADVIL ,MOTRIN ) 800 MG tablet Take 1 tablet (800 mg total) by mouth 3 (three) times daily. 21 tablet 0   Insulin Aspart FlexPen (NOVOLOG) 100 UNIT/ML Inject 18 Units into the skin.     Insulin  Glargine (BASAGLAR  [place]PEN) 100 UNIT/ML Inject 45 Units into the skin at bedtime. 15 mL 3   Insulin Pen Needle (BD PEN NEEDLE MINI ULTRAFINE) 31G X 5 MM MISC daily.     Lancets 28G Thin MISC Check glucose up to 3 times daily     loperamide (IMODIUM) 2 MG capsule Take 2 mg by mouth.     losartan  (COZAAR ) 25 MG tablet Take 1 tablet (25 mg total) by mouth daily. 90 tablet 3   lovastatin  (MEVACOR ) 10 MG tablet Take 1 tablet (10 mg total) by mouth at bedtime. 90 tablet 3   meclizine  (ANTIVERT ) 25 MG tablet Take 1 tablet (25 mg total) by mouth 3 (three) times daily for the next two  days (Friday & Saturday) then may take 1 tablet three times a day AS NEEDED for dizziness/vertigo 30 tablet 0   naproxen (NAPROSYN) 500 MG tablet Take 500 mg by mouth 2 (two) times daily.     ondansetron (ZOFRAN) 4 MG tablet Take 4 mg by mouth.     OZEMPIC , 2 MG/DOSE, 8 MG/3ML SOPN Inject 2 mg into the skin once a week. 9 mL 3   No facility-administered medications prior to visit.   Allergies  Allergen Reactions   Latex Dermatitis and Other (See Comments)    Burning and itching  Burning and itching  Other reaction(s): Other (See Comments)  Burning and itching  Other reaction(s): Other (See Comments)  Burning and itching  Other reaction(s): Other (See Comments)  Burning and itching   Penicillins Itching   Objective:   Today's Vitals   08/12/24 1528  BP: 120/74  Pulse: 79  Temp: (!) 97 F (36.1 C)  TempSrc: Temporal  SpO2: 96%  Weight: 232 lb 3.2 oz (105.3 kg)  Height: 5' 1 (1.549 m)   Body mass index is 43.87 kg/m.   Neck circumference: 47.5 cm  General: Well developed, well nourished. No acute distress. Psych: Alert and oriented. Normal mood and affect.  Health Maintenance Due  Topic Date Due   OPHTHALMOLOGY EXAM  Never done   HIV Screening  Never done   Hepatitis C Screening  Never done   DTaP/Tdap/Td (1 - Tdap) Never done   Pneumococcal Vaccine (1 of 2 - PCV) Never done   Hepatitis B Vaccines 19-59 Average Risk (1 of 3 - 19+ 3-dose series) Never done   HPV VACCINES (1 - 3-dose SCDM series) Never done   Cervical Cancer Screening (HPV/Pap Cotest)  Never done   Colonoscopy  Never done   STOP-Bang Score for Sleep Apnea Screening  Patient Self-Reported Questions         Score Do you snore loudly? (louder than  1  talking or sufficiently loud to be  heard through doors)  Do you often feel tired, fatigued, or  1  sleepy during the daytime?  Has anyone observed you stop breathing 0  during sleep?  Do you have (or are you being treated for) 0  high blood  pressure?  Clinical Information          Score BMI>35 kg/m2     1  Age > 50 years    0  Neck circumference > 40 cm   1  Gender (female)     0   Total      4  A score <3 indicates a low risk of sleep apnea.  Assessment & Plan:   Problem List Items Addressed This Visit       Endocrine  Type 2 diabetes mellitus with stage 2 chronic kidney disease, with long-term current use of insulin (HCC)   Back on meds. Continue insulin aspart (Novolog) 18 units with meals, semaglutide  (Ozempic ) 2 mg weekly and insulin glargine  (Basaglar ) 45 units at bedtime. Plan to recheck her A1c next month.        Musculoskeletal and Integument   Plantar fasciitis   She has failed more conservative approaches. She was given the number to call for an appointment with podiatry today. She should discuss a possible steroid injeciton of her heel with them.        Other   Anxiety and depression - Primary   Improved. Continue citalopram  20 mg daily.      Other Visit Diagnoses       At risk for sleep apnea       I will refer her to neurology to consider a sleep study.   Relevant Orders   Ambulatory referral to Sleep Studies     Other fatigue       As above. Likely due to undiagnosed sleep apena.       Return in about 4 weeks (around 09/09/2024) for Reassessment.   Garnette CHRISTELLA Simpler, MD

## 2024-08-12 NOTE — Assessment & Plan Note (Signed)
 Back on meds. Continue insulin aspart (Novolog) 18 units with meals, semaglutide  (Ozempic ) 2 mg weekly and insulin glargine  (Basaglar ) 45 units at bedtime. Plan to recheck her A1c next month.

## 2024-08-13 NOTE — Progress Notes (Signed)
 Patient notified VIA phone. Dm/cma

## 2024-08-18 ENCOUNTER — Encounter: Payer: Self-pay | Admitting: Family Medicine

## 2024-09-21 ENCOUNTER — Encounter: Payer: Self-pay | Admitting: Family Medicine

## 2024-09-21 ENCOUNTER — Ambulatory Visit: Admitting: Family Medicine

## 2024-09-21 VITALS — BP 132/72 | HR 86 | Temp 97.0°F | Resp 18 | Wt 232.0 lb

## 2024-09-21 DIAGNOSIS — E782 Mixed hyperlipidemia: Secondary | ICD-10-CM | POA: Diagnosis not present

## 2024-09-21 DIAGNOSIS — H60501 Unspecified acute noninfective otitis externa, right ear: Secondary | ICD-10-CM

## 2024-09-21 DIAGNOSIS — Z794 Long term (current) use of insulin: Secondary | ICD-10-CM

## 2024-09-21 DIAGNOSIS — N182 Chronic kidney disease, stage 2 (mild): Secondary | ICD-10-CM | POA: Diagnosis not present

## 2024-09-21 DIAGNOSIS — Z7985 Long-term (current) use of injectable non-insulin antidiabetic drugs: Secondary | ICD-10-CM

## 2024-09-21 DIAGNOSIS — E1122 Type 2 diabetes mellitus with diabetic chronic kidney disease: Secondary | ICD-10-CM | POA: Diagnosis not present

## 2024-09-21 DIAGNOSIS — Z1231 Encounter for screening mammogram for malignant neoplasm of breast: Secondary | ICD-10-CM

## 2024-09-21 MED ORDER — NEOMYCIN-POLYMYXIN-HC 3.5-10000-1 OT SOLN: 3.0000 [drp] | Freq: Four times a day (QID) | OTIC | 0 refills | Status: DC

## 2024-09-21 NOTE — Assessment & Plan Note (Signed)
 Lipids are at goal. Continue lovastatin  10 mg daily.

## 2024-09-21 NOTE — Assessment & Plan Note (Signed)
 Back on meds. Continue insulin aspart (Novolog) 18 units with meals, semaglutide  (Ozempic ) 2 mg weekly and insulin glargine  (Basaglar ) 45 units at bedtime. We will check her A1c today.

## 2024-09-21 NOTE — Progress Notes (Signed)
 [facility] [address] [place] [address] Dept: [phone] Dept Fax: [phone]  Chronic Care Office Visit  Subjective:    Patient ID: Laurie Neal, female    DOB: 03/14/1976, 48 y.o..   MRN: [id number]  Chief Complaint  Patient presents with   Medical Management of Chronic Issues    1 month follow up  HM due- vaccinations (patient declined) mammogram, diabetic eye exam and cervical screening report requested from [facility]    History of Present Illness:  Patient is in today for reassessment of chronic medical issues.  Ms. Laurie Neal  has a history of Type 2 diabetes, diagnosed around 7-8 years ago. She was managed previously on metformin, Janumet, and Trulicity, however is currently managed on insulin glargine  (Lantus) 45 units at bedtime, insulin aspart (Novolog) 18 units with meals, and semaglutide  (Ozempic ) 2 mg weekly. She is havign some nausea due tot he Ozempic  use. She also complains of burning pain at the site of the injections.   [name] notes a 3-day history of right ear pain. She notes she was cleaning this with a swab recently and had some bloody discharge. Her hearing has been unchanged.  Past Medical History: Patient Active Problem List   Diagnosis Date Noted   Plantar fasciitis 08/12/2024   Chronic kidney disease (CKD) stage G2/A2, mildly decreased glomerular filtration rate (GFR) between 60-89 mL/min/1.73 square meter and albuminuria creatinine ratio between 30-299 mg/g 06/03/2024   Hyperlipidemia 12/02/2023   Gallbladder polyp 03/04/2023   Morbid obesity (HCC) 09/10/2022   Allergic rhinitis 09/08/2018   Sickle cell trait 01/27/2018   Anxiety and depression 01/27/2018   Type 2 diabetes mellitus with stage 2 chronic kidney disease, with long-term current use of insulin (HCC) 09/30/2017   Functional dyspepsia 09/30/2017   Microalbuminuria 07/03/2017   Liver lesion 12/13/2015   Past  Surgical History:  Procedure Laterality Date   CARPAL TUNNEL RELEASE Left    TUBAL LIGATION     WISDOM TOOTH EXTRACTION     Family History  Problem Relation Age of Onset   Hypertension Mother    Cancer Father        prostrate cancer   Hypertension Maternal Aunt    Cancer Maternal Grandmother        Throat   Outpatient Medications Prior to Visit  Medication Sig Dispense Refill   citalopram  (CELEXA ) 20 MG tablet Take 1 tablet (20 mg total) by mouth daily. 90 tablet 3   Continuous Glucose Sensor (FREESTYLE LIBRE 2 SENSOR) MISC Use as directed 6 each 3   glucose blood (FREESTYLE TEST STRIPS) test strip Check sugars twice daily     ibuprofen  (ADVIL ,MOTRIN ) 800 MG tablet Take 1 tablet (800 mg total) by mouth 3 (three) times daily. 21 tablet 0   Insulin Aspart FlexPen (NOVOLOG) 100 UNIT/ML Inject 18 Units into the skin.     Insulin Glargine  (BASAGLAR  KWIKPEN) 100 UNIT/ML Inject 45 Units into the skin at bedtime. 15 mL 3   Insulin Pen Needle (BD PEN NEEDLE MINI ULTRAFINE) 31G X 5 MM MISC daily.     Lancets 28G Thin MISC Check glucose up to 3 times daily     loperamide (IMODIUM) 2 MG capsule Take 2 mg by mouth.     losartan  (COZAAR ) 25 MG tablet Take 1 tablet (25 mg total) by mouth daily. 90 tablet 3   lovastatin  (MEVACOR ) 10 MG tablet Take 1 tablet (10 mg total) by mouth at bedtime. 90 tablet 3  meclizine  (ANTIVERT ) 25 MG tablet Take 1 tablet (25 mg total) by mouth 3 (three) times daily for the next two days (Friday & Saturday) then may take 1 tablet three times a day AS NEEDED for dizziness/vertigo 30 tablet 0   naproxen (NAPROSYN) 500 MG tablet Take 500 mg by mouth 2 (two) times daily.     ondansetron (ZOFRAN) 4 MG tablet Take 4 mg by mouth.     OZEMPIC , 2 MG/DOSE, 8 MG/3ML SOPN Inject 2 mg into the skin once a week. 9 mL 3   No facility-administered medications prior to visit.   Allergies  Allergen Reactions   Latex Dermatitis and Other (See Comments)    Burning and  itching  Burning and itching  Other reaction(s): Other (See Comments)  Burning and itching  Other reaction(s): Other (See Comments)  Burning and itching  Other reaction(s): Other (See Comments)  Burning and itching   Penicillins Itching   Objective:   Today's Vitals   09/21/24 1543  BP: 132/72  Pulse: 86  Resp: 18  Temp: (!) 97 F (36.1 C)  TempSrc: Temporal  SpO2: 100%  Weight: 232 lb (105.2 kg)  PainSc: 0-No pain   Body mass index is 43.84 kg/m.   General: Well developed, well nourished. No acute distress. HEENT: Normocephalic, non-traumatic. External ears normal. EAC and TMs normal bilaterally.  Psych: Alert and oriented. Normal mood and affect.  Health Maintenance Due  Topic Date Due   OPHTHALMOLOGY EXAM  Never done   HIV Screening  Never done   Hepatitis C Screening  Never done   Colonoscopy  Never done   COVID-19 Vaccine (1 - 2024-25 season) Never done     Assessment & Plan:   Problem List Items Addressed This Visit       Endocrine   Type 2 diabetes mellitus with stage 2 chronic kidney disease, with long-term current use of insulin (HCC) - Primary   Back on meds. Continue insulin aspart (Novolog) 18 units with meals, semaglutide  (Ozempic ) 2 mg weekly and insulin glargine  (Basaglar ) 45 units at bedtime. We will check her A1c today.      Relevant Orders   Glucose, random   Hemoglobin A1c     Genitourinary   Chronic kidney disease (CKD) stage G2/A2, mildly decreased glomerular filtration rate (GFR) between 60-89 mL/min/1.73 square meter and albuminuria creatinine ratio between 30-299 mg/g   Focus on blood pressure and glucose control, adequate hydration, and avoidance of nephrotoxic medications. Currently on an ARB.          Other   Hyperlipidemia   Lipids are at goal. Continue lovastatin  10 mg daily.      Other Visit Diagnoses       Acute otitis externa of right ear, unspecified type       Symptoms are c/w an external otitis. I will prescribe  cortisporin drops for her to use for 3-4 days.     Encounter for screening mammogram for malignant neoplasm of breast       Relevant Orders   MM DIGITAL SCREENING BILATERAL       Return in about 3 months (around 12/21/2024) for Reassessment.   Garnette CHRISTELLA Simpler, MD

## 2024-09-21 NOTE — Assessment & Plan Note (Signed)
 Focus on blood pressure and glucose control, adequate hydration, and avoidance of nephrotoxic medications. Currently on an ARB.

## 2024-09-22 ENCOUNTER — Ambulatory Visit

## 2024-09-22 ENCOUNTER — Other Ambulatory Visit

## 2024-09-22 DIAGNOSIS — E1122 Type 2 diabetes mellitus with diabetic chronic kidney disease: Secondary | ICD-10-CM

## 2024-09-22 LAB — GLUCOSE, RANDOM: Glucose, Bld: 270 mg/dL — ABNORMAL HIGH (ref 70–99)

## 2024-09-23 ENCOUNTER — Ambulatory Visit: Payer: Self-pay | Admitting: Family Medicine

## 2024-09-23 DIAGNOSIS — N182 Chronic kidney disease, stage 2 (mild): Secondary | ICD-10-CM

## 2024-09-23 DIAGNOSIS — E1122 Type 2 diabetes mellitus with diabetic chronic kidney disease: Secondary | ICD-10-CM

## 2024-09-23 LAB — HEMOGLOBIN A1C
Est. average glucose Bld gHb Est-mCnc: 226 mg/dL
Hgb A1c MFr Bld: 9.5 % — ABNORMAL HIGH (ref 4.8–5.6)

## 2024-09-23 MED ORDER — EMPAGLIFLOZIN 10 MG PO TABS: 10.0000 mg | ORAL_TABLET | Freq: Every day | ORAL | 3 refills | Status: AC

## 2024-10-12 ENCOUNTER — Encounter

## 2024-11-09 ENCOUNTER — Ambulatory Visit
Admission: RE | Admit: 2024-11-09 | Discharge: 2024-11-09 | Disposition: A | Discharge status: Home / Self Care | Source: Ambulatory Visit | Attending: Family Medicine | Admitting: Family Medicine

## 2024-11-09 DIAGNOSIS — Z1231 Encounter for screening mammogram for malignant neoplasm of breast: Secondary | ICD-10-CM

## 2024-11-11 ENCOUNTER — Other Ambulatory Visit: Payer: Self-pay | Admitting: Family Medicine

## 2024-11-11 DIAGNOSIS — N63 Unspecified lump in unspecified breast: Secondary | ICD-10-CM

## 2024-11-18 ENCOUNTER — Other Ambulatory Visit: Payer: Self-pay | Admitting: Family Medicine

## 2024-12-22 ENCOUNTER — Ambulatory Visit: Admitting: Family Medicine

## 2024-12-22 ENCOUNTER — Encounter: Payer: Self-pay | Admitting: Family Medicine

## 2024-12-22 VITALS — BP 126/80 | HR 81 | Temp 98.2°F | Ht 61.0 in | Wt 230.2 lb

## 2024-12-22 DIAGNOSIS — E1122 Type 2 diabetes mellitus with diabetic chronic kidney disease: Secondary | ICD-10-CM | POA: Diagnosis not present

## 2024-12-22 DIAGNOSIS — E782 Mixed hyperlipidemia: Secondary | ICD-10-CM | POA: Diagnosis not present

## 2024-12-22 DIAGNOSIS — Z7985 Long-term (current) use of injectable non-insulin antidiabetic drugs: Secondary | ICD-10-CM

## 2024-12-22 DIAGNOSIS — Z7984 Long term (current) use of oral hypoglycemic drugs: Secondary | ICD-10-CM

## 2024-12-22 DIAGNOSIS — Z794 Long term (current) use of insulin: Secondary | ICD-10-CM

## 2024-12-22 DIAGNOSIS — F32A Depression, unspecified: Secondary | ICD-10-CM | POA: Diagnosis not present

## 2024-12-22 DIAGNOSIS — F419 Anxiety disorder, unspecified: Secondary | ICD-10-CM

## 2024-12-22 DIAGNOSIS — N182 Chronic kidney disease, stage 2 (mild): Secondary | ICD-10-CM

## 2024-12-22 MED ORDER — INSULIN ASPART FLEXPEN 100 UNIT/ML ~~LOC~~ SOPN: 18.0000 [IU] | PEN_INJECTOR | Freq: Three times a day (TID) | SUBCUTANEOUS | 3 refills | Status: DC

## 2024-12-22 MED ORDER — BUSPIRONE HCL 10 MG PO TABS: 10.0000 mg | ORAL_TABLET | Freq: Two times a day (BID) | ORAL | 3 refills | Status: AC | PRN

## 2024-12-22 MED ORDER — BASAGLAR KWIKPEN 100 UNIT/ML ~~LOC~~ SOPN: 45.0000 [IU] | PEN_INJECTOR | Freq: Every day | SUBCUTANEOUS | 3 refills | Status: DC

## 2024-12-22 NOTE — Assessment & Plan Note (Addendum)
 I will reassess her A1c today. Continue insulin aspart (Novolog) 18 units with meals, insulin glargine  (Basaglar ) 45 units at bedtime, semaglutide  (Ozempic ) 2 mg weekly, and empagliflozin  10 mg daily.

## 2024-12-22 NOTE — Assessment & Plan Note (Signed)
 Lipids are at goal. Continue lovastatin  10 mg daily.

## 2024-12-22 NOTE — Progress Notes (Signed)
 " [facility] [address] [place] [address] Dept: [phone] Dept Fax: [phone]  Chronic Care Office Visit  Subjective:    Patient ID: Laurie Neal, female    DOB: 1976-08-10, 48 y.o..   MRN: [id number]  Chief Complaint  Patient presents with   Diabetes    3 month f/u Diabetes.  No concerns.     History of Present Illness:  Patient is in today for reassessment of chronic medical conditions.   Laurie Neal  has a history of Type 2 diabetes, diagnosed ~2017. She was managed previously on metformin, Janumet, and Trulicity, however is currently managed on insulin glargine  (Lantus) 45 units at bedtime, insulin aspart (Novolog) 18 units with meals, semaglutide  (Ozempic ) 2 mg weekly, and empagliflozin  (Jardiance  ) 10 mg daily (added after last visit).  Laurie Neal has a history of anxiety. She has noted increased episodes of acute anxiety/panic in the past months. She admits that she has had significant stressors at work. She will be switching to a new job in 2 weeks. She also feels the holiday pressures may have triggered some of this. She is currently managed on citalopram  20 mg daily.  Past Medical History: Patient Active Problem List   Diagnosis Date Noted   Plantar fasciitis 08/12/2024   Chronic kidney disease (CKD) stage G2/A2, mildly decreased glomerular filtration rate (GFR) between 60-89 mL/min/1.73 square meter and albuminuria creatinine ratio between 30-299 mg/g 06/03/2024   Hyperlipidemia 12/02/2023   Gallbladder polyp 03/04/2023   Morbid obesity (HCC) 09/10/2022   Allergic rhinitis 09/08/2018   Sickle cell trait 01/27/2018   Anxiety and depression 01/27/2018   Type 2 diabetes mellitus with stage 2 chronic kidney disease, with long-term current use of insulin (HCC) 09/30/2017   Functional dyspepsia 09/30/2017   Microalbuminuria 07/03/2017   Liver lesion 12/13/2015   Past Surgical History:  Procedure Laterality  Date   CARPAL TUNNEL RELEASE Left    TUBAL LIGATION     WISDOM TOOTH EXTRACTION     Family History  Problem Relation Age of Onset   Hypertension Mother    Cancer Father        prostrate cancer   Hypertension Maternal Aunt    Cancer Maternal Grandmother        Throat   Outpatient Medications Prior to Visit  Medication Sig Dispense Refill   citalopram  (CELEXA ) 20 MG tablet Take 1 tablet (20 mg total) by mouth daily. 90 tablet 3   Continuous Glucose Sensor (FREESTYLE LIBRE 2 SENSOR) MISC Use as directed 6 each 3   empagliflozin  (JARDIANCE ) 10 MG TABS tablet Take 1 tablet (10 mg total) by mouth daily. 90 tablet 3   glucose blood (FREESTYLE TEST STRIPS) test strip Check sugars twice daily     ibuprofen  (ADVIL ,MOTRIN ) 800 MG tablet Take 1 tablet (800 mg total) by mouth 3 (three) times daily. 21 tablet 0   Insulin Aspart FlexPen (NOVOLOG) 100 UNIT/ML Inject 18 Units into the skin.     Insulin Glargine  (BASAGLAR  KWIKPEN) 100 UNIT/ML Inject 45 Units into the skin at bedtime. 15 mL 3   Insulin Pen Needle (BD PEN NEEDLE MINI ULTRAFINE) 31G X 5 MM MISC daily.     Lancets 28G Thin MISC Check glucose up to 3 times daily     loperamide (IMODIUM) 2 MG capsule Take 2 mg by mouth.     losartan  (COZAAR ) 25 MG tablet Take 1 tablet (25 mg total) by mouth daily. 90 tablet 3  lovastatin  (MEVACOR ) 10 MG tablet Take 1 tablet (10 mg total) by mouth at bedtime. 90 tablet 3   meclizine  (ANTIVERT ) 25 MG tablet Take 1 tablet (25 mg total) by mouth 3 (three) times daily for the next two days (Friday & Saturday) then may take 1 tablet three times a day AS NEEDED for dizziness/vertigo 30 tablet 0   naproxen (NAPROSYN) 500 MG tablet TAKE ONE TABLET BY MOUTH TWICE A DAY 240 tablet 0   neomycin -polymyxin-hydrocortisone (CORTISPORIN) OTIC solution Place 3 drops into the right ear 4 (four) times daily. 10 mL 0   ondansetron (ZOFRAN) 4 MG tablet Take 4 mg by mouth.     OZEMPIC , 2 MG/DOSE, 8 MG/3ML SOPN Inject 2 mg into  the skin once a week. 9 mL 3   No facility-administered medications prior to visit.   Allergies[1]   Objective:   Today's Vitals   12/22/24 1538  BP: 126/80  Pulse: 81  Temp: 98.2 F (36.8 C)  TempSrc: Temporal  SpO2: 97%  Weight: 230 lb 3.2 oz (104.4 kg)  Height: 5' 1 (1.549 m)   Body mass index is 43.5 kg/m.   General: Well developed, well nourished. No acute distress. Psych: Alert and oriented. Normal mood and affect.  Health Maintenance Due  Topic Date Due   OPHTHALMOLOGY EXAM  Never done   HIV Screening  Never done   Hepatitis C Screening  Never done   Colonoscopy  Never done   Influenza Vaccine  Never done   COVID-19 Vaccine (1 - 2025-26 season) Never done   Lab Results Lab Results  Component Value Date   HGBA1C 9.5 (H) 09/22/2024   HGBA1C 11.4 (H) 06/03/2024   Lipid Panel:  Lab Results  Component Value Date   CHOL 159 06/02/2024   HDL 32.00 (L) 06/02/2024   LDLCALC 99 06/02/2024   TRIG 142.0 06/02/2024     Assessment & Plan:   Problem List Items Addressed This Visit       Endocrine   Type 2 diabetes mellitus with stage 2 chronic kidney disease, with long-term current use of insulin (HCC) - Primary   I will reassess her A1c today. Continue insulin aspart (Novolog) 18 units with meals, insulin glargine  (Basaglar ) 45 units at bedtime, semaglutide  (Ozempic ) 2 mg weekly, and empagliflozin  10 mg daily.      Relevant Medications   Insulin Glargine  (BASAGLAR  KWIKPEN) 100 UNIT/ML   Insulin Aspart FlexPen (NOVOLOG) 100 UNIT/ML   Other Relevant Orders   Glucose, random   Hemoglobin A1c     Genitourinary   Chronic kidney disease (CKD) stage G2/A2, mildly decreased glomerular filtration rate (GFR) between 60-89 mL/min/1.73 square meter and albuminuria creatinine ratio between 30-299 mg/g   Focus on blood pressure and glucose control, adequate hydration, and avoidance of nephrotoxic medications. Currently on an ARB and SGLT2i         Other   Anxiety  and depression   Some recent increase in acute anxiety. Continue citalopram  20 mg daily. I will try her on buspirone 10 mg bid as needed.      Relevant Medications   busPIRone (BUSPAR) 10 MG tablet   Hyperlipidemia   Lipids are at goal. Continue lovastatin  10 mg daily.      Other Visit Diagnoses       Long-term current use of injectable noninsulin antidiabetic medication         Encounter for long-term (current) insulin use (HCC)         Long term  current use of oral hypoglycemic drug           Return in about 3 months (around 03/22/2025) for Reassessment.   Garnette CHRISTELLA Simpler, MD  I,Emily Lagle,acting as a scribe for Garnette CHRISTELLA Simpler, MD.,have documented all relevant documentation on the behalf of Garnette CHRISTELLA Simpler, MD.  I, Garnette CHRISTELLA Simpler, MD, have reviewed all documentation for this visit. The documentation on 12/22/2024 for the exam, diagnosis, procedures, and orders are all accurate and complete.     [1]  Allergies Allergen Reactions   Latex Dermatitis and Other (See Comments)    Burning and itching  Burning and itching  Other reaction(s): Other (See Comments)  Burning and itching  Other reaction(s): Other (See Comments)  Burning and itching  Other reaction(s): Other (See Comments)  Burning and itching   Penicillins Itching   "

## 2024-12-22 NOTE — Assessment & Plan Note (Signed)
 Some recent increase in acute anxiety. Continue citalopram  20 mg daily. I will try her on buspirone 10 mg bid as needed.

## 2024-12-22 NOTE — Assessment & Plan Note (Addendum)
 Focus on blood pressure and glucose control, adequate hydration, and avoidance of nephrotoxic medications. Currently on an ARB and SGLT2i

## 2024-12-23 ENCOUNTER — Encounter: Payer: Self-pay | Admitting: Family Medicine

## 2024-12-23 ENCOUNTER — Other Ambulatory Visit

## 2024-12-23 DIAGNOSIS — E1122 Type 2 diabetes mellitus with diabetic chronic kidney disease: Secondary | ICD-10-CM

## 2024-12-23 LAB — GLUCOSE, RANDOM: Glucose, Bld: 326 mg/dL — ABNORMAL HIGH (ref 70–99)

## 2024-12-24 LAB — HEMOGLOBIN A1C
Est. average glucose Bld gHb Est-mCnc: 249 mg/dL
Hgb A1c MFr Bld: 10.3 % — ABNORMAL HIGH (ref 4.8–5.6)

## 2024-12-25 ENCOUNTER — Ambulatory Visit: Payer: Self-pay | Admitting: Family Medicine

## 2024-12-25 ENCOUNTER — Encounter: Payer: Self-pay | Admitting: Family Medicine

## 2024-12-25 DIAGNOSIS — E1122 Type 2 diabetes mellitus with diabetic chronic kidney disease: Secondary | ICD-10-CM

## 2024-12-25 MED ORDER — INSULIN ASPART FLEXPEN 100 UNIT/ML ~~LOC~~ SOPN: 20.0000 [IU] | PEN_INJECTOR | Freq: Three times a day (TID) | SUBCUTANEOUS | Status: AC

## 2024-12-25 MED ORDER — BASAGLAR KWIKPEN 100 UNIT/ML ~~LOC~~ SOPN: 50.0000 [IU] | PEN_INJECTOR | Freq: Every day | SUBCUTANEOUS | Status: AC

## 2025-03-22 ENCOUNTER — Ambulatory Visit: Admitting: Family Medicine
